# Patient Record
Sex: Male | Born: 1960 | Race: Black or African American | Hispanic: No | Marital: Married | Smoking: Former smoker
Health system: Southern US, Community
[De-identification: ages and names within clinical notes are randomized; demographics above are authoritative.]

## PROBLEM LIST (undated history)

## (undated) DIAGNOSIS — T7840XA Allergy, unspecified, initial encounter: Secondary | ICD-10-CM

## (undated) DIAGNOSIS — F1910 Other psychoactive substance abuse, uncomplicated: Secondary | ICD-10-CM

## (undated) DIAGNOSIS — M1990 Unspecified osteoarthritis, unspecified site: Secondary | ICD-10-CM

## (undated) DIAGNOSIS — I10 Essential (primary) hypertension: Secondary | ICD-10-CM

## (undated) DIAGNOSIS — H269 Unspecified cataract: Secondary | ICD-10-CM

## (undated) DIAGNOSIS — F329 Major depressive disorder, single episode, unspecified: Secondary | ICD-10-CM

## (undated) DIAGNOSIS — G4730 Sleep apnea, unspecified: Secondary | ICD-10-CM

## (undated) DIAGNOSIS — J189 Pneumonia, unspecified organism: Secondary | ICD-10-CM

## (undated) DIAGNOSIS — D689 Coagulation defect, unspecified: Secondary | ICD-10-CM

## (undated) DIAGNOSIS — R7303 Prediabetes: Secondary | ICD-10-CM

## (undated) DIAGNOSIS — E785 Hyperlipidemia, unspecified: Secondary | ICD-10-CM

## (undated) DIAGNOSIS — F32A Depression, unspecified: Secondary | ICD-10-CM

## (undated) DIAGNOSIS — R519 Headache, unspecified: Secondary | ICD-10-CM

## (undated) DIAGNOSIS — F419 Anxiety disorder, unspecified: Secondary | ICD-10-CM

## (undated) HISTORY — DX: Coagulation defect, unspecified: D68.9

## (undated) HISTORY — DX: Allergy, unspecified, initial encounter: T78.40XA

## (undated) HISTORY — DX: Sleep apnea, unspecified: G47.30

## (undated) HISTORY — DX: Unspecified cataract: H26.9

## (undated) HISTORY — DX: Major depressive disorder, single episode, unspecified: F32.9

## (undated) HISTORY — DX: Hyperlipidemia, unspecified: E78.5

## (undated) HISTORY — DX: Depression, unspecified: F32.A

## (undated) HISTORY — DX: Essential (primary) hypertension: I10

## (undated) HISTORY — DX: Anxiety disorder, unspecified: F41.9

## (undated) HISTORY — DX: Other psychoactive substance abuse, uncomplicated: F19.10

## (undated) HISTORY — DX: Unspecified osteoarthritis, unspecified site: M19.90

---
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
## 1996-01-06 HISTORY — PX: KNEE CARTILAGE SURGERY: SHX688

## 2008-01-06 HISTORY — PX: COLONOSCOPY: SHX174

## 2009-10-20 ENCOUNTER — Emergency Department (HOSPITAL_COMMUNITY)
Admission: EM | Admit: 2009-10-20 | Discharge: 2009-10-20 | Payer: Self-pay | Source: Home / Self Care | Admitting: Emergency Medicine

## 2010-01-05 HISTORY — PX: COLONOSCOPY W/ POLYPECTOMY: SHX1380

## 2012-06-21 ENCOUNTER — Ambulatory Visit: Payer: Self-pay | Admitting: Family Medicine

## 2012-06-29 ENCOUNTER — Encounter: Payer: Self-pay | Admitting: Family Medicine

## 2012-06-29 ENCOUNTER — Ambulatory Visit (INDEPENDENT_AMBULATORY_CARE_PROVIDER_SITE_OTHER): Payer: BC Managed Care – PPO | Admitting: Family Medicine

## 2012-06-29 VITALS — BP 133/79 | HR 76 | Temp 98.2°F | Ht 77.0 in | Wt 264.0 lb

## 2012-06-29 DIAGNOSIS — R635 Abnormal weight gain: Secondary | ICD-10-CM

## 2012-06-29 DIAGNOSIS — F3289 Other specified depressive episodes: Secondary | ICD-10-CM

## 2012-06-29 DIAGNOSIS — M25562 Pain in left knee: Secondary | ICD-10-CM | POA: Insufficient documentation

## 2012-06-29 DIAGNOSIS — M545 Low back pain, unspecified: Secondary | ICD-10-CM | POA: Insufficient documentation

## 2012-06-29 DIAGNOSIS — M25569 Pain in unspecified knee: Secondary | ICD-10-CM

## 2012-06-29 DIAGNOSIS — F17200 Nicotine dependence, unspecified, uncomplicated: Secondary | ICD-10-CM | POA: Insufficient documentation

## 2012-06-29 DIAGNOSIS — F329 Major depressive disorder, single episode, unspecified: Secondary | ICD-10-CM | POA: Insufficient documentation

## 2012-06-29 DIAGNOSIS — F411 Generalized anxiety disorder: Secondary | ICD-10-CM | POA: Insufficient documentation

## 2012-06-29 DIAGNOSIS — Z720 Tobacco use: Secondary | ICD-10-CM

## 2012-06-29 MED ORDER — CITALOPRAM HYDROBROMIDE 40 MG PO TABS: 40.0000 mg | ORAL_TABLET | Freq: Every day | ORAL | Status: DC

## 2012-06-29 MED ORDER — VARENICLINE TARTRATE 0.5 MG PO TABS: 1.0000 mg | ORAL_TABLET | Freq: Two times a day (BID) | ORAL | Status: DC

## 2012-06-29 NOTE — Assessment & Plan Note (Signed)
A: chronic pain. No evidence of inflammatory arthritis. P:  Knee x-ray Likely corticosteroid injection at f/u.

## 2012-06-29 NOTE — Assessment & Plan Note (Signed)
A: chronic. No red flags. P: DG lumbar and likely PT f/u.

## 2012-06-29 NOTE — Progress Notes (Addendum)
Subjective:     Patient ID: Jerry Stein, male   DOB: 01/25/1960, 52 y.o.   MRN: [id number]  HPI 52 yo M presents to established care: previous PCP in [place].  1. Chronic back pain: history of sciatica. Now with low back pain, L side. Radiates down thigh. Associated with L knee pain. No recent injury. No fecal/urinary incontinence. No numbness or LE weakness. Previously taking muscle relaxer (Skelaxin).   2. Chronic L knee pain: history of meniscal injury and cartilage surgery. Now with lateral knee pain. Associated with swelling.   3. Smoking: smokes 1/3 PPD. Ready to quit. successfully quit in the past with chantix.   4. Mood: history of anxiety and depression. Compliant with celexa 40 mg daily. Had BZ for two weeks. This helped with anxiety, but he is aware that this is a short term medication.   Health maintenance: colonoscopy in 2012 ([facility]).   Review of Systems Patient Information Form: Screening and ROS  AUDIT-C Score: 4 Do you feel safe in relationships? yes PHQ-2:positive  Review of Symptoms  General:  Negative for nexplained weight loss, fever Skin: Negative for new or changing mole, sore that won't heal HEENT: Positive trouble seeing (wears reading glasses, last eye exam 3-5 years ago). Negative for trouble hearing, ringing in ears, mouth sores, hoarseness, change in voice, dysphagia. CV:  Negative for chest pain, dyspnea, edema, palpitations Resp: Positive for cough (history of sleep apnea). Negative dyspnea, hemoptysis GI: Negative for nausea, vomiting, diarrhea, constipation, abdominal pain, melena, hematochezia. GU: Negative for dysuria, incontinence, urinary hesitance, hematuria, vaginal or penile discharge, polyuria, sexual difficulty, lumps in testicle or breasts MSK: Positive for joint pain or swelling. Negative for muscle cramps.  Neuro: Positive for dizziness.  Negative for headaches, weakness, numbness, passing out/fainting Psych: Positive for depression,  anxiety. Negative for memory problems    Objective:   Physical Exam BP 133/79  Pulse 76  Temp(Src) 98.2 F (36.8 C) (Oral)  Ht 6\' 5"  (1.956 m)  Wt 264 lb (119.75 kg)  BMI 31.3 kg/m2 General appearance: alert and cooperative Head: Normocephalic, without obvious abnormality, atraumatic Eyes: conjunctivae/corneas clear. PERRL, EOM's intact.  Ears: normal TM's and external ear canals both ears Nose: Nares normal. Septum midline. Mucosa normal. No drainage or sinus tenderness. Throat: lips, mucosa, and tongue normal; teeth and gums normal Neck: no adenopathy, no carotid bruit, no JVD, supple, symmetrical, trachea midline and thyroid not enlarged, symmetric, no tenderness/mass/nodules Lungs: clear to auscultation bilaterally Heart: regular rate and rhythm, S1, S2 normal, no murmur, click, rub or gallop Abdomen: soft, non-tender; bowel sounds normal; no masses,  no organomegaly Back: Normal Curvature, no deformities or CVA tenderness  Paraspinal Tenderness: L lumbar   LE Strength 5/5  LE Sensation: in tact  LE Reflexes 2+ and symmetric  Straight leg raise negative  L knee: Full ROM, crepitus, no effusion.      Assessment and Plan:

## 2012-06-29 NOTE — Assessment & Plan Note (Signed)
A: ready to quit P: restart chantix per orders. F/u in 2-4 weeks.

## 2012-06-29 NOTE — Patient Instructions (Addendum)
Jerry Stein,  Thank you for coming in today. It was very nice and easy. Please get your x-rays done at her convenience. Please schedule Jerry Stein appointment take her blood work done one morning when you're fasting.   Regarding smoking: Start Chantix take 1 tab daily for the first 3 days then increase to 1 tab twice daily for 4 days. Then increase to 2 tabs twice daily. The Chantix UR device to quit smoking within the first 8-35 days after you've started the medication. I recommended he set a quit date and put on your calendar frequently to see.  Plan to followup with me in 4-6 weeks review of x-rays and blood work.  Dr. Armen Pickup

## 2012-06-29 NOTE — Assessment & Plan Note (Signed)
A: patient working to lose, but has gained about 10 lbs P: Check fasting CMP and lipid panel.

## 2012-06-29 NOTE — Assessment & Plan Note (Signed)
A: mood stable P: continue celexa.

## 2012-07-05 ENCOUNTER — Telehealth: Payer: Self-pay | Admitting: Family Medicine

## 2012-07-05 MED ORDER — BUPROPION HCL ER (SR) 150 MG PO TB12: ORAL_TABLET | ORAL | Status: DC

## 2012-07-05 NOTE — Telephone Encounter (Signed)
Called patient. Called in zyban. Discussed how to take medication: one pill daily for 3 days, then BID (8 hrs apart, last dose before 6 pm) Quit after one week.  Plan to be on medication for 12 weeks.

## 2012-07-05 NOTE — Telephone Encounter (Signed)
Patient is calling about the Chantix being too expensive with his insurance.  Is there a way that he can get something like it cheaper.

## 2012-07-05 NOTE — Telephone Encounter (Signed)
Will fwd to MD.  Braylei Totino L, CMA  

## 2012-07-07 ENCOUNTER — Ambulatory Visit
Admission: RE | Admit: 2012-07-07 | Discharge: 2012-07-07 | Disposition: A | Discharge status: Home / Self Care | Payer: BC Managed Care – PPO | Source: Ambulatory Visit | Attending: Family Medicine | Admitting: Family Medicine

## 2012-07-07 DIAGNOSIS — M545 Low back pain: Secondary | ICD-10-CM

## 2012-07-07 DIAGNOSIS — M25562 Pain in left knee: Secondary | ICD-10-CM

## 2012-07-11 ENCOUNTER — Telehealth: Payer: Self-pay | Admitting: Family Medicine

## 2012-07-11 DIAGNOSIS — M545 Low back pain: Secondary | ICD-10-CM

## 2012-07-11 DIAGNOSIS — M25562 Pain in left knee: Secondary | ICD-10-CM

## 2012-07-11 MED ORDER — ACETAMINOPHEN ER 650 MG PO TBCR: 650.0000 mg | EXTENDED_RELEASE_TABLET | Freq: Three times a day (TID) | ORAL | Status: DC | PRN

## 2012-07-11 NOTE — Assessment & Plan Note (Signed)
Schedule tylenol  PT F/u fibular lesion with MRI if pain does not improve with above.

## 2012-07-11 NOTE — Telephone Encounter (Signed)
Called patient. Reviewed knee and low back x-ray.  Left knee DJD-medial and patellofemoral Radiolucent lesion L proximal fibular with benign features noted. Patient does have lateral knee pain.   Low back DJD. L5-S1.   Patient with moderate pain. Takes naproxen prn.  Plan: Schedule tylenol Referral to PT.

## 2012-07-12 ENCOUNTER — Telehealth: Payer: Self-pay | Admitting: Family Medicine

## 2012-07-12 DIAGNOSIS — R0683 Snoring: Secondary | ICD-10-CM | POA: Insufficient documentation

## 2012-07-12 DIAGNOSIS — Z720 Tobacco use: Secondary | ICD-10-CM

## 2012-07-12 NOTE — Telephone Encounter (Signed)
Opened in error

## 2012-07-20 ENCOUNTER — Ambulatory Visit: Payer: BC Managed Care – PPO | Admitting: Physical Therapy

## 2012-07-22 ENCOUNTER — Ambulatory Visit: Payer: BC Managed Care – PPO | Attending: Family Medicine

## 2012-07-22 DIAGNOSIS — R5381 Other malaise: Secondary | ICD-10-CM | POA: Insufficient documentation

## 2012-07-22 DIAGNOSIS — M6281 Muscle weakness (generalized): Secondary | ICD-10-CM | POA: Insufficient documentation

## 2012-07-22 DIAGNOSIS — M5430 Sciatica, unspecified side: Secondary | ICD-10-CM | POA: Insufficient documentation

## 2012-07-22 DIAGNOSIS — IMO0001 Reserved for inherently not codable concepts without codable children: Secondary | ICD-10-CM | POA: Insufficient documentation

## 2012-07-25 ENCOUNTER — Encounter: Payer: Self-pay | Admitting: Family Medicine

## 2012-07-26 ENCOUNTER — Ambulatory Visit: Payer: BC Managed Care – PPO | Admitting: Rehabilitation

## 2012-08-02 ENCOUNTER — Ambulatory Visit: Payer: BC Managed Care – PPO | Admitting: Rehabilitation

## 2012-08-04 ENCOUNTER — Ambulatory Visit: Payer: BC Managed Care – PPO | Admitting: Rehabilitation

## 2012-08-05 ENCOUNTER — Ambulatory Visit: Payer: BC Managed Care – PPO | Admitting: Rehabilitation

## 2012-08-09 ENCOUNTER — Ambulatory Visit: Payer: BC Managed Care – PPO | Admitting: Rehabilitation

## 2013-09-05 ENCOUNTER — Encounter: Payer: Self-pay | Admitting: Family Medicine

## 2013-09-05 ENCOUNTER — Ambulatory Visit (INDEPENDENT_AMBULATORY_CARE_PROVIDER_SITE_OTHER): Payer: BC Managed Care – PPO | Admitting: Family Medicine

## 2013-09-05 VITALS — BP 132/84 | HR 82 | Temp 98.3°F | Ht 77.0 in | Wt 265.5 lb

## 2013-09-05 DIAGNOSIS — F17200 Nicotine dependence, unspecified, uncomplicated: Secondary | ICD-10-CM

## 2013-09-05 DIAGNOSIS — Z720 Tobacco use: Secondary | ICD-10-CM

## 2013-09-05 DIAGNOSIS — M79672 Pain in left foot: Secondary | ICD-10-CM

## 2013-09-05 DIAGNOSIS — M79609 Pain in unspecified limb: Secondary | ICD-10-CM

## 2013-09-05 MED ORDER — NAPROXEN 500 MG PO TABS: 500.0000 mg | ORAL_TABLET | Freq: Two times a day (BID) | ORAL | Status: DC

## 2013-09-05 NOTE — Progress Notes (Signed)
Patient ID: Jerry Stein, male   DOB: Aug 17, 1960, 53 y.o.   MRN: [id number]  Kevin Fenton, MD Phone: [phone]  Subjective:  Chief complaint-noted  [name]  Pain started 9 months ago and has gradually worsened since then Not limiting his activuity, moderate in severity, causes him to limp Walking all day at work worsens it and tylenol and a brace help minimally No swelling, injury, or erythema Had previous ACL surgery on L and feels it is due to this.  The pain sometinmes is a burning sensation, sometimes radaites up the ant portion of his leg to his knee.   ROS-  Per HPI No fevers, dyspnea, chest pain  Past Medical History Patient Active Problem List   Diagnosis Date Noted  . Left foot pain 09/05/2013  . Snoring 07/12/2012  . Tobacco abuse 06/29/2012  . Depression 06/29/2012  . Generalized anxiety disorder 06/29/2012  . Weight gain 06/29/2012  . Left knee pain 06/29/2012  . Low back pain 06/29/2012    Medications- reviewed and updated Current Outpatient Prescriptions  Medication Sig Dispense Refill  . acetaminophen (TYLENOL 8 HOUR) 650 MG CR tablet Take 1 tablet (650 mg total) by mouth every 8 (eight) hours as needed for pain.      Marland Kitchen buPROPion (WELLBUTRIN SR) 150 MG 12 hr tablet One pill daily for 3 days, then one pill twice a day.  60 tablet  1  . citalopram (CELEXA) 40 MG tablet Take 1 tablet (40 mg total) by mouth daily.  90 tablet  0  . DHEA 50 MG CAPS Take 1 capsule by mouth daily.      . naproxen (NAPROSYN) 500 MG tablet Take 1 tablet (500 mg total) by mouth 2 (two) times daily with a meal.  60 tablet  2   No current facility-administered medications for this visit.    Objective: BP 132/84  Pulse 82  Temp(Src) 98.3 F (36.8 C) (Oral)  Ht  (1.956 m)  Wt 265 lb 8 oz (120.43 kg)  BMI 31.48 kg/m2 Gen: NAD, alert, cooperative with exam HEENT: NCAT Ext: No edema, warm Neuro: Alert and oriented, No gross deficits MSK: L foot without  erythema, sweeling, or gross deformity, strength 5/5 and full rom, Slight tenderness to palpation over distal portion of extensor retinaculum on dorsum of foot.    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   Tobacco abuse 2 ciggs a day, trying to quit Encouraged   No orders of the defined types were placed in this encounter.    Meds ordered this encounter  Medications  . naproxen (NAPROSYN) 500 MG tablet    Sig: Take 1 tablet (500 mg total) by mouth 2 (two) times daily with a meal.    Dispense:  60 tablet    Refill:  2

## 2013-09-05 NOTE — Assessment & Plan Note (Signed)
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

## 2013-09-05 NOTE — Assessment & Plan Note (Signed)
2 ciggs a day, trying to quit Encouraged

## 2013-09-05 NOTE — Patient Instructions (Signed)
Good to meet you!  Try to get your insurance, it would help a lot!  Try the naproxen I sent, if it is expensive you can use 2 aleve twice a day (generic is ok, you may be able to take just 2 at lunch and cover your whole day)  Try to ice it for 15 minutes 2-3 times a day  Consider an ankle sleeve  Come back as needed and if this doesn't resolve in the next month or so

## 2013-10-25 ENCOUNTER — Encounter (HOSPITAL_COMMUNITY): Payer: Self-pay | Admitting: Emergency Medicine

## 2013-10-25 ENCOUNTER — Emergency Department (HOSPITAL_COMMUNITY)
Admission: EM | Admit: 2013-10-25 | Discharge: 2013-10-25 | Disposition: A | Discharge status: Home / Self Care | Payer: BC Managed Care – PPO | Attending: Emergency Medicine | Admitting: Emergency Medicine

## 2013-10-25 DIAGNOSIS — M5442 Lumbago with sciatica, left side: Secondary | ICD-10-CM | POA: Insufficient documentation

## 2013-10-25 DIAGNOSIS — F419 Anxiety disorder, unspecified: Secondary | ICD-10-CM | POA: Insufficient documentation

## 2013-10-25 DIAGNOSIS — Z720 Tobacco use: Secondary | ICD-10-CM | POA: Insufficient documentation

## 2013-10-25 DIAGNOSIS — F329 Major depressive disorder, single episode, unspecified: Secondary | ICD-10-CM | POA: Insufficient documentation

## 2013-10-25 DIAGNOSIS — Z79899 Other long term (current) drug therapy: Secondary | ICD-10-CM | POA: Insufficient documentation

## 2013-10-25 DIAGNOSIS — M791 Myalgia: Secondary | ICD-10-CM | POA: Insufficient documentation

## 2013-10-25 MED ORDER — PREDNISONE 20 MG PO TABS: 40.0000 mg | ORAL_TABLET | Freq: Every day | ORAL | Status: DC

## 2013-10-25 MED ORDER — OXYCODONE-ACETAMINOPHEN 5-325 MG PO TABS: 1.0000 | ORAL_TABLET | Freq: Four times a day (QID) | ORAL | Status: DC | PRN

## 2013-10-25 MED ORDER — CYCLOBENZAPRINE HCL 10 MG PO TABS: 10.0000 mg | ORAL_TABLET | Freq: Every day | ORAL | Status: DC

## 2013-10-25 NOTE — Discharge Instructions (Signed)
Call for a follow up appointment with a Family or Primary Care Provider.  Return if symptoms worsen.   Take medication as prescribed.  Do not operate heavy machinery, drink alcohol with taking narcotic pain medication and/or flexeril medication.

## 2013-10-25 NOTE — ED Notes (Signed)
Pt reports having muscles spasms and pain to lower back x 2 days. Hx of same in past. Ambulatory at triage with walker.

## 2013-10-25 NOTE — ED Provider Notes (Signed)
CSN: [id number]     Arrival date & time 10/25/13  1125 History  This chart was scribed for non-physician practitioner, Jerry SealLauren M Chen Saadeh, PA-C, working with Jerry KaplanAnkit Nanavati, MD by Jerry Stein, ED Scribe. This patient was seen in room TR05C/TR05C and the patient's care was started at 12:50 PM.   Chief Complaint  Patient presents with  . Back Pain   HPI Comments: Jerry Stein is a 53 y.o. male who presents to the Emergency Department complaining of lower back pain that radiates down left leg onset 2 days ago. Pt reports similar pain in the past. Pt reports lifting a kidney table at work 1 week ago, denies immediate pain reports increase in discomfort over several days. Pain is exacerbated with certain movements. He denies abdominal pain, any urinary symptoms, numbness. Pt has taken Naproxen x2 daily without relief. No h/o DM.  PCP: Jerry Stein  The history is provided by the patient. No language interpreter was used.   Past Medical History  Diagnosis Date  . Depression   . Anxiety   . Substance abuse 1979    once    Past Surgical History  Procedure Laterality Date  . Knee cartilage surgery  1998  . Eye surgery Left 1976    blood clot in retina, removed, 1 month of vision loss  . Colonoscopy w/ polypectomy  2012    5 benign polyps    Family History  Problem Relation Age of Onset  . Kidney disease Mother     died of renal failure   . Heart disease Mother     had pacemaker, took NTG.   . Cancer Sister [id number]    breast cancer   . Alcohol abuse Brother   . Cirrhosis Brother     related to alcoholism  . Cancer Maternal Grandmother     uterine cancer   . Hypertension Neg Hx   . Diabetes Brother    History  Substance Use Topics  . Smoking status: Current Every Day Smoker -- 0.30 packs/day    Types: Cigarettes  . Smokeless tobacco: Never Used     Comment: tring to quit  . Alcohol Use: Yes     Comment: 4-5 drinks a month     Review of Systems  Constitutional: Negative for  fever and chills.  Gastrointestinal: Negative for abdominal pain.  Genitourinary: Negative.  Negative for dysuria, urgency, hematuria, flank pain and difficulty urinating.  Musculoskeletal: Positive for back pain and myalgias.  Skin: Negative for wound.  Neurological: Negative for weakness and numbness.  All other systems reviewed and are negative.  Allergies  Shrimp  Home Medications   Prior to Admission medications   Medication Sig Start Date End Date Taking? Authorizing Provider  acetaminophen (TYLENOL 8 HOUR) 650 MG CR tablet Take 1 tablet (650 mg total) by mouth every 8 (eight) hours as needed for pain. 07/11/12   Jerry CarwinJosalyn C Funches, MD  buPROPion (WELLBUTRIN SR) 150 MG 12 hr tablet One pill daily for 3 days, then one pill twice a day. 07/05/12   Jerry C Funches, MD  citalopram (CELEXA) 40 MG tablet Take 1 tablet (40 mg total) by mouth daily. 06/29/12   Jerry C Funches, MD  DHEA 50 MG CAPS Take 1 capsule by mouth daily.    Historical Provider, MD  naproxen (NAPROSYN) 500 MG tablet Take 1 tablet (500 mg total) by mouth 2 (two) times daily with a meal. 09/05/13   Jerry GammaSamuel L Bradshaw, MD   Triage Vitals: BP 144/91  Pulse 71  Temp(Src) 97.9 F (36.6 C) (Oral)  Resp 18  SpO2 96% Physical Exam  Nursing note and vitals reviewed. Constitutional: He is oriented to person, place, and time. He appears well-developed and well-nourished. No distress.  HENT:  Head: Normocephalic and atraumatic.  Eyes: Conjunctivae and EOM are normal.  Neck: Neck supple.  Pulmonary/Chest: Effort normal. No respiratory distress.  Abdominal: Normal appearance. There is no tenderness.  Musculoskeletal: Normal range of motion.       Lumbar back: He exhibits tenderness. He exhibits normal range of motion.       Back:  Palpation of left SI joint recreates discomfort. Positive leg raise. Normal sensation and strength bilaterally. Patient is able to ambulate with a walker.  Neurological: He is alert and oriented to  person, place, and time.  Skin: Skin is warm and dry. He is not diaphoretic.  Psychiatric: He has a normal mood and affect. His behavior is normal.   ED Course  Procedures (including critical care time) DIAGNOSTIC STUDIES: Oxygen Saturation is 96% on RA, adequate by my interpretation.    COORDINATION OF CARE: 12:57 PM-Discussed treatment plan which includes medication for pain and follow-up with PCP with pt at bedside and pt agreed to plan.   Labs Review Labs Reviewed - No data to display  Imaging Review No results found.   EKG Interpretation None      MDM   Final diagnoses:  Bilateral low back pain with left-sided sciatica   Patient with back pain.  No neurological deficits and normal neuro exam.  Patient can walk but states is painful.  No loss of bowel or bladder control.  No concern for cauda equina.  No fever, night sweats, weight loss, h/o cancer, IVDU.  RICE protocol and pain medicine indicated and discussed with patient.  EMR shows lumbar XR 07/07/2012: IMPRESSION: Normal alignment. Mild degenerative disc disease at L5-S1. Meds given in ED:  Medications - No data to display  Discharge Medication List as of 10/25/2013  1:10 PM    START taking these medications   Details  cyclobenzaprine (FLEXERIL) 10 MG tablet Take 1 tablet (10 mg total) by mouth at bedtime., Starting 10/25/2013, Until Discontinued, Print    oxyCODONE-acetaminophen (PERCOCET) 5-325 MG per tablet Take 1 tablet by mouth every 6 (six) hours as needed., Starting 10/25/2013, Until Discontinued, Print    predniSONE (DELTASONE) 20 MG tablet Take 2 tablets (40 mg total) by mouth daily., Starting 10/25/2013, Until Discontinued, Print       I personally performed the services described in this documentation, which was scribed in my presence. The recorded information has been reviewed and is accurate.  Jerry DrownLauren Semaya Vida, PA-C 10/25/13 [phone]

## 2013-10-26 NOTE — ED Provider Notes (Signed)
Medical screening examination/treatment/procedure(s) were performed by non-physician practitioner and as supervising physician I was immediately available for consultation/collaboration.   EKG Interpretation None       Carsen Machi, MD 10/26/13 0836 

## 2013-12-05 ENCOUNTER — Encounter: Payer: Self-pay | Admitting: Family Medicine

## 2013-12-05 ENCOUNTER — Ambulatory Visit (INDEPENDENT_AMBULATORY_CARE_PROVIDER_SITE_OTHER): Payer: Self-pay | Admitting: Family Medicine

## 2013-12-05 VITALS — BP 127/92 | HR 91 | Temp 98.1°F | Ht 77.0 in | Wt 266.9 lb

## 2013-12-05 DIAGNOSIS — M545 Low back pain, unspecified: Secondary | ICD-10-CM

## 2013-12-05 DIAGNOSIS — M79672 Pain in left foot: Secondary | ICD-10-CM

## 2013-12-05 MED ORDER — AMITRIPTYLINE HCL 25 MG PO TABS: 25.0000 mg | ORAL_TABLET | Freq: Every day | ORAL | Status: DC

## 2013-12-05 MED ORDER — NAPROXEN 375 MG PO TABS: 375.0000 mg | ORAL_TABLET | Freq: Two times a day (BID) | ORAL | Status: DC

## 2013-12-05 NOTE — Progress Notes (Signed)
Patient ID: Jerry Stein, male   DOB: February 13, 1960, 53 y.o.   MRN: [id number]   HPI  Patient presents today for follow-up low back pain  Low back pain Has been bothering him chronically for 20+ years. States that the pain is a 7 out of 10 today and has been helped some by naproxen today. He had good relief with Percocet, Flexeril, and prednisone prescribed by the ER about a month ago He denies bowel or bladder dysfunction, saddle anesthesia, lower extremity weakness During his acute pain he had pain with lifting his leg but none today. States that he has instructions for exercises from physical therapy previously that he started doing again since his acute exacerbation.  Left foot pain Bothering him daily for the last year. Has had some improvement with NSAIDs No swelling, redness, or injury of the foot.  Smoking status noted ROS: Per HPI  He has not been taking his Wellbutrin. States he is taking his Celexa  Objective: BP 127/92 mmHg  Pulse 91  Temp(Src) 98.1 F (36.7 C) (Oral)  Ht 6\' 5"  (1.956 m)  Wt 266 lb 14.4 oz (121.065 kg)  BMI 31.64 kg/m2 Gen: NAD, alert, cooperative with exam HEENT: NCAT, EOMI, PERRL CV: RRR, good S1/S2, no murmur Resp: CTABL, no wheezes, non-labored Abd: SNTND, BS present, no guarding or organomegaly Ext: No edema, warm Neuro: Alert and oriented, strength 5/5 in bilateral lower extremities, 1+ patellar reflexes MSK: Back: Him relief of pain with paraspinal palpation, no bony tenderness, full range of motion Left foot: Tenderness to palpation over the talus and medially across the extensor retinaculum, no joint laxity, no drawer sign, no erythema or gross deformity  Assessment and plan:  Low back pain Chronic back pain with spasms Start trial of amitriptyline, DC Flexeril Continue NSAIDs as needed  Left foot pain Left ankle pain, likely tendinopathy Some concern for pes planus Amitriptyline started for back spasm may help as well Will refer  to sports medicine for evaluation of ankle pain and possible orthotics.    Orders Placed This Encounter  Procedures  . Ambulatory referral to Sports Medicine    Referral Priority:  Routine    Referral Type:  Consultation    Number of Visits Requested:  1    Meds ordered this encounter  Medications  . amitriptyline (ELAVIL) 25 MG tablet    Sig: Take 1 tablet (25 mg total) by mouth at bedtime.    Dispense:  30 tablet    Refill:  2

## 2013-12-05 NOTE — Assessment & Plan Note (Signed)
Chronic back pain with spasms Start trial of amitriptyline, DC Flexeril Continue NSAIDs as needed

## 2013-12-05 NOTE — Patient Instructions (Signed)
Great to meet you today!  Try the amitriptyline, take this medicine every night  You should be called by sports medicine to set up an appointment with them.  Come back to see me in 2-3 months.  Remember the red flags of emergency back pain we discussed which include bowel or bladder incontinence, numbness and tingling in her underwear area, or weakness of your legs.

## 2013-12-05 NOTE — Assessment & Plan Note (Addendum)
Left ankle pain, likely tendinopathy Some concern for pes planus Amitriptyline started for back spasm may help as well Will refer to sports medicine for evaluation of ankle pain and possible orthotics.

## 2014-01-11 ENCOUNTER — Ambulatory Visit: Payer: Self-pay | Admitting: Sports Medicine

## 2014-01-19 ENCOUNTER — Encounter: Payer: Self-pay | Admitting: Family Medicine

## 2014-01-19 ENCOUNTER — Ambulatory Visit (INDEPENDENT_AMBULATORY_CARE_PROVIDER_SITE_OTHER): Payer: 59 | Admitting: Family Medicine

## 2014-01-19 VITALS — BP 144/102 | Ht 78.0 in | Wt 256.0 lb

## 2014-01-19 DIAGNOSIS — M2141 Flat foot [pes planus] (acquired), right foot: Secondary | ICD-10-CM | POA: Insufficient documentation

## 2014-01-19 DIAGNOSIS — M25572 Pain in left ankle and joints of left foot: Secondary | ICD-10-CM | POA: Insufficient documentation

## 2014-01-19 DIAGNOSIS — M2142 Flat foot [pes planus] (acquired), left foot: Secondary | ICD-10-CM

## 2014-01-19 MED ORDER — METHYLPREDNISOLONE ACETATE 40 MG/ML IJ SUSP
40.0000 mg | Freq: Once | INTRAMUSCULAR | Status: AC
  Administered 2014-01-19: 40 mg via INTRA_ARTICULAR

## 2014-01-19 NOTE — Assessment & Plan Note (Addendum)
-  Sport insoles with scaphoid pads crafted for the patient today -Ankle exercises -Follow-up in 1-2 months, may benefit from a body helix compression sleeve for the left ankle and/or custom orthotics. He is sized 16 shoe.

## 2014-01-19 NOTE — Assessment & Plan Note (Signed)
-  Seemingly due to lateral ankle impingement syndrome due to pes planus -Obtain x-rays of the left ankle -Likely ankle arthritis is a contributing factor -Ankle injection performed today, patient tolerated this well. -Follow-up in 1-2 months or sooner if needed

## 2014-01-19 NOTE — Progress Notes (Signed)
   Subjective:    Patient ID: Arcelia JewJeffrey Varricchio, male    DOB: 03/08/1960, 54 y.o.   MRN: [id number]  HPI Mr. Leonor LivHolt is a 54 year old male who presents with left ankle pain. Onset of symptoms was over 2 years ago without any known acute injury. He does have a remote history of repeated left ankle inversion injuries when he was younger. He has bilateral pes planus, and has tried over-the-counter insoles. Location of pain is primarily anterior lateral talar joint. He denies any swelling, bruising, warmth, or erythema. He works for 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 Symptoms are worse towards the end of the day, and aggravated with standing and walking. He has tried naproxen twice daily, and he is on amitriptyline for chronic back pain.  Past medical history, social history, medications, and allergies were reviewed and are up to date in the chart.  Review of Systems 7 point review of systems was performed and was otherwise negative unless noted in the history of present illness.     Objective:   Physical Exam BP 144/102 mmHg  Ht 6\' 6"  (1.981 m)  Wt 256 lb (116.121 kg)  BMI 29.59 kg/m2 GEN: The patient is well-developed well-nourished male and in no acute distress.  He is awake alert and oriented x3. SKIN: warm and well-perfused, no rash  EXTR: No lower extremity edema or calf tenderness Neuro: Strength 5/5 globally. Sensation intact throughout. No focal deficits. Vasc: +2 bilateral distal pulses. No edema.  MSK: Examination of the bilateral feet reveals dropping of the medial longitudinal arch with hyperpronation bilaterally. He also has some loss of the transverse arch over the third through fifth metatarsal heads on the left. No warmth or induration. Normal swelling. Tenderness to palpation located in the lateral talar joint. No bony tenderness.  Procedure: After obtaining verbal consent the patient's skin  was cleansed with alcohol and Betadine.  He was subsequently injected with a 1-3 mixture of methylprednisolone 40mg  per 3 mL of 1% lidocaine plain into the left ankle joint using the anterior medial approach.  Prior to the procedure risks, benefits and treatment alternatives were discussed.  He had no complications.  He tolerated the procedure.    Assessment & Plan:  Please see problem based assessment and plan in the problem list.

## 2014-01-22 ENCOUNTER — Ambulatory Visit
Admission: RE | Admit: 2014-01-22 | Discharge: 2014-01-22 | Disposition: A | Discharge status: Home / Self Care | Payer: 59 | Source: Ambulatory Visit | Attending: Family Medicine | Admitting: Family Medicine

## 2014-01-22 DIAGNOSIS — M25572 Pain in left ankle and joints of left foot: Secondary | ICD-10-CM

## 2014-02-07 ENCOUNTER — Encounter: Payer: Self-pay | Admitting: Family Medicine

## 2014-03-02 ENCOUNTER — Ambulatory Visit (INDEPENDENT_AMBULATORY_CARE_PROVIDER_SITE_OTHER): Payer: 59 | Admitting: Family Medicine

## 2014-03-02 DIAGNOSIS — M25572 Pain in left ankle and joints of left foot: Secondary | ICD-10-CM

## 2014-03-04 NOTE — Progress Notes (Signed)
Opened in error. Pt cancelled.

## 2014-03-05 ENCOUNTER — Ambulatory Visit: Payer: 59 | Admitting: Family Medicine

## 2014-03-09 ENCOUNTER — Encounter: Payer: Self-pay | Admitting: Family Medicine

## 2014-03-09 ENCOUNTER — Ambulatory Visit (INDEPENDENT_AMBULATORY_CARE_PROVIDER_SITE_OTHER): Payer: 59 | Admitting: Family Medicine

## 2014-03-09 VITALS — BP 140/90 | HR 90 | Temp 98.0°F | Ht 78.0 in | Wt 260.0 lb

## 2014-03-09 DIAGNOSIS — M2142 Flat foot [pes planus] (acquired), left foot: Secondary | ICD-10-CM

## 2014-03-09 DIAGNOSIS — IMO0001 Reserved for inherently not codable concepts without codable children: Secondary | ICD-10-CM

## 2014-03-09 DIAGNOSIS — R0683 Snoring: Secondary | ICD-10-CM

## 2014-03-09 DIAGNOSIS — R030 Elevated blood-pressure reading, without diagnosis of hypertension: Secondary | ICD-10-CM

## 2014-03-09 DIAGNOSIS — M2141 Flat foot [pes planus] (acquired), right foot: Secondary | ICD-10-CM

## 2014-03-09 DIAGNOSIS — F411 Generalized anxiety disorder: Secondary | ICD-10-CM

## 2014-03-09 MED ORDER — AMITRIPTYLINE HCL 50 MG PO TABS: 50.0000 mg | ORAL_TABLET | Freq: Every day | ORAL | Status: DC

## 2014-03-09 NOTE — Patient Instructions (Addendum)
Great to see you!  We will work on an ENT referral, when I get your records we can work on a CPAP or BiPAP machine  I have sent an crease dose of amitriptyline today, you may start taking 2 pills every night until you are out of your current pills and then get a refill.  Let's follow up in one month to discuss your mood in more detail.   Sleep Apnea Sleep apnea is disorder that affects a person's sleep. A person with sleep apnea has abnormal pauses in their breathing when they sleep. It is hard for them to get a good sleep. This makes a person tired during the day. It also can lead to other physical problems. There are three types of sleep apnea. One type is when breathing stops for a short time because your airway is blocked (obstructive sleep apnea). Another type is when the brain sometimes fails to give the normal signal to breathe to the muscles that control your breathing (central sleep apnea). The third type is a combination of the other two types. HOME CARE  Do not sleep on your back. Try to sleep on your side.  Take all medicine as told by your doctor.  Avoid alcohol, calming medicines (sedatives), and depressant drugs.  Try to lose weight if you are overweight. Talk to your doctor about a healthy weight goal. Your doctor may have you use a device that helps to open your airway. It can help you get the air that you need. It is called a positive airway pressure (PAP) device. There are three types of PAP devices:  Continuous positive airway pressure (CPAP) device.  Nasal expiratory positive airway pressure (EPAP) device.  Bilevel positive airway pressure (BPAP) device. MAKE SURE YOU:  Understand these instructions.  Will watch your condition.  Will get help right away if you are not doing well or get worse. Document Released: 10/01/2007 Document Revised: 12/09/2011 Document Reviewed: 04/25/2011 [facility] Patient Information 2015 [employer] [place]. This information is not intended  to replace advice given to you by your health care provider. Make sure you discuss any questions you have with your health care provider.

## 2014-03-09 NOTE — Progress Notes (Signed)
Patient ID: Jerry Stein, male   DOB: 1960/09/05, 54 y.o.   MRN: [id number]   HPI  Patient presents today for follow-up and to request ENT referral  Snoring and sleep study Patient explains that he was seen in [place] by an ear nose and throat doctor who did a sleep study and stated that he had sleep apnea. He stated that he may benefit from a tonsillectomy/adenoidectomy, so he would like to be referred to ENT today He states he has daytime sleepiness and severe snoring that disrupts his wife.  Pain Has left foot pain due to pes planus, takes 1 naproxen twice daily with some improvement of pain Also taking amitriptyline which seems to be helping his mood as well as his pain, it also helps him sleep.  Mood Has anxiety/depression disorder with some symptoms of both, denies SI today Is taking amitriptyline, has not been taking citalopram for quite some time.  Smoking status noted ROS: Per HPI  Objective: BP 140/90 mmHg  Pulse 90  Temp(Src) 98 F (36.7 C) (Oral)  Ht 6\' 6"  (1.981 m)  Wt 260 lb (117.935 kg)  BMI 30.05 kg/m2 Gen: NAD, alert, cooperative with exam HEENT: NCAT CV: RRR, good S1/S2, no murmur Resp: CTABL, no wheezes, non-labored Ext: No edema, warm Neuro: Alert and oriented, No gross deficits  Assessment and plan:  Generalized anxiety disorder Follow-up in one month to discuss in more detail No SI Go up on amitriptyline   Snoring Per patient he saw ENT in [place] and had a sleep study which said that he had OSA I requested records today His ENT thought he might benefit from tonsillectomy and adenoidectomy, will refer to ENT for reevaluation here    Pes planus of both feet Continued pain helped by naproxen Increase amitriptyline to help alleviate the burden of chronic pain   Elevated blood pressure Elevated BP, No previous HTN Dx Discuss at follow up in 1 month     Orders Placed This Encounter  Procedures  . Ambulatory referral to ENT   Referral Priority:  Routine    Referral Type:  Consultation    Referral Reason:  Specialty Services Required    Requested Specialty:  Otolaryngology    Number of Visits Requested:  1    Meds ordered this encounter  Medications  . amitriptyline (ELAVIL) 50 MG tablet    Sig: Take 1 tablet (50 mg total) by mouth at bedtime.    Dispense:  30 tablet    Refill:  2

## 2014-03-09 NOTE — Assessment & Plan Note (Signed)
Elevated BP, No previous HTN Dx Discuss at follow up in 1 month

## 2014-03-09 NOTE — Assessment & Plan Note (Signed)
Per patient he saw [place]NT in [place] and had a sleep study which said that he had OSA I requested records today His ENT thought he might benefit from tonsillectomy and adenoidectomy, will refer to ENT for reevaluation here

## 2014-03-09 NOTE — Assessment & Plan Note (Signed)
Continued pain helped by naproxen Increase amitriptyline to help alleviate the burden of chronic pain

## 2014-03-09 NOTE — Assessment & Plan Note (Signed)
Follow-up in one month to discuss in more detail No SI Go up on amitriptyline

## 2014-03-13 ENCOUNTER — Telehealth: Payer: Self-pay | Admitting: Family Medicine

## 2014-03-13 DIAGNOSIS — F411 Generalized anxiety disorder: Secondary | ICD-10-CM

## 2014-03-13 NOTE — Telephone Encounter (Signed)
Would like RX for something to quit smoking [facility] [place]

## 2014-03-15 MED ORDER — BUPROPION HCL ER (SR) 150 MG PO TB12: 150.0000 mg | ORAL_TABLET | Freq: Two times a day (BID) | ORAL | Status: DC

## 2014-03-15 NOTE — Telephone Encounter (Signed)
Pt wants to know if amitriptyline is compatible with the generic of Wellbutrin Please advise

## 2014-03-15 NOTE — Telephone Encounter (Signed)
Yes, it is ok to have both.   Murtis SinkSam Bradshaw, MD [facility] Resident, PGY-3 03/15/2014, 3:43 PM

## 2014-03-15 NOTE — Telephone Encounter (Signed)
Pt informed and will come in and sign release form. Deseree Bruna PotterBlount, CMA

## 2014-03-15 NOTE — Telephone Encounter (Signed)
Called and discussed Celexa refill with patient. We have previously discussed that he was not taking it for several months. My assumption was that he understood that he was changing to amitriptyline. I told him that I prefer to just continue on amitriptyline and not take any Celexa, so denies a refill request.  He also requests something to help him quit smoking. He was successful with Chantix previously requests Chantix or Wellbutrin. Given his anxiety and depression I think Wellbutrin is probably the most appropriate medication. I sent the Rx.  Murtis SinkSam Shiron Whetsel, MD [facility] Resident, PGY-3 03/15/2014, 8:33 AM

## 2014-03-15 NOTE — Telephone Encounter (Signed)
Additionally, he left our last visit without signing the back side of his consent to get his medical records. Nursing tried to reach him but couldn't, he states that he is willing to sign it and would like to, will ask nursing to contact him and arrange.  Murtis SinkSam Bradshaw, MD [facility] Resident, PGY-3 03/15/2014, 8:34 AM

## 2014-04-02 ENCOUNTER — Telehealth: Payer: Self-pay | Admitting: Family Medicine

## 2014-04-02 NOTE — Telephone Encounter (Signed)
Received notes from ENT. Patient had Sleep study ordered but no results were sent.   Will ask nurisng to help me track down Sleep study results.   Saw Dr. Archie Balboaobert Harley at [phone]  Murtis SinkSam Bradshaw, MD [facility] Resident, PGY-3 04/02/2014, 12:25 PM

## 2014-04-03 NOTE — Telephone Encounter (Signed)
ROI refaxed to Dr. Verdene RioHarley's office, specifically asking for sleep study results.

## 2014-06-26 ENCOUNTER — Ambulatory Visit (HOSPITAL_BASED_OUTPATIENT_CLINIC_OR_DEPARTMENT_OTHER): Payer: 59 | Attending: Otolaryngology

## 2014-06-26 DIAGNOSIS — G4733 Obstructive sleep apnea (adult) (pediatric): Secondary | ICD-10-CM | POA: Insufficient documentation

## 2014-06-27 ENCOUNTER — Ambulatory Visit: Payer: 59 | Admitting: Family Medicine

## 2014-07-05 ENCOUNTER — Ambulatory Visit (INDEPENDENT_AMBULATORY_CARE_PROVIDER_SITE_OTHER): Payer: 59 | Admitting: Family Medicine

## 2014-07-05 ENCOUNTER — Encounter: Payer: Self-pay | Admitting: Family Medicine

## 2014-07-05 VITALS — BP 147/83 | HR 95 | Temp 97.9°F | Ht 78.0 in | Wt 275.4 lb

## 2014-07-05 DIAGNOSIS — M25572 Pain in left ankle and joints of left foot: Secondary | ICD-10-CM | POA: Diagnosis not present

## 2014-07-05 DIAGNOSIS — M79673 Pain in unspecified foot: Secondary | ICD-10-CM | POA: Diagnosis not present

## 2014-07-05 MED ORDER — METHYLPREDNISOLONE ACETATE 40 MG/ML IJ SUSP
40.0000 mg | Freq: Once | INTRAMUSCULAR | Status: AC
  Administered 2014-07-05: 40 mg via INTRA_ARTICULAR

## 2014-07-05 NOTE — Patient Instructions (Signed)
It was a pleasure seeing you today in our clinic. Today we discussed your ankle pain. Here is the treatment plan we have discussed and agreed upon together:   - We were able to inject your ankle today using a similar approach and medication to what you received for your first injection. This should provided similar relief as the first injection. However, it is not uncommon to have a slightly reduced effect the second time around. - If your ankle becomes red, tender, or inflamed over the next week please do not hesitate to seek medical attention. If this is necessary, please inform them of this injection.

## 2014-07-06 NOTE — Assessment & Plan Note (Signed)
Patient c/o ankle pain c/w osteoarthritic pain. He had received an injection in this ankle back in January and tolerated the procedure well. He states this injection's benefit is only just now diminishing. Patient received another injection at this office visit. He tolerated it very well. Reported immediate relief. I discussed w/ him the risks and warning signs of infection. He states his understanding.

## 2014-07-06 NOTE — Progress Notes (Signed)
   HPI  CC: Lt foot pain Patient c/o Lt foot pain. He was seen for this issue by sports medicine back in January. He received a steroid injection at that time. He reports great relief from that injection but it has started to worsen recently. Pain is achy in nature. Localized to the anterolateral aspect of the talocrural joint. He states the the pain is slowly making its way medially. Worse with prolonged use. He denies trauma, swelling, erythema, weakness, instability, numbness, fevers, chills, or swelling/erythema/tenderness in other joints.  Review of Systems   See HPI for ROS. All other systems reviewed and are negative.  Past medical history and social history reviewed and updated in the EMR as appropriate.  Objective: BP 147/83 mmHg  Pulse 95  Temp(Src) 97.9 F (36.6 C) (Oral)  Ht 6\' 6"  (1.981 m)  Wt 275 lb 6.4 oz (124.921 kg)  BMI 31.83 kg/m2 Gen: NAD, alert, cooperative CV: RRR, no murmur Resp: CTAB, no wheezes, non-labored Abd: SNTND, BS present, no guarding or organomegaly Ext: No edema, warm. Bilateral LE w/o swelling, erythema, or severe tenderness. ROM good but reports some pain w/ FROM. Strength 5/5 bilaterally. Sensation intact. Posterior tibialis/dorsalis pedis pulses intact bilaterally. Neuro: Alert and oriented, Speech clear, No gross deficits  Procedure: INJECTION: Patient was given informed consent, signed copy in the chart. Appropriate time out was taken. Area prepped in usual sterile fashion. 1 cc of methylprednisolone 40 mg/ml plus  3 cc of 1% lidocaine without epinephrine was injected into the Lt talocrural joint using a(n) anteromedial approach. The patient tolerated the procedure well. There were no complications.   Assessment and plan:  Left ankle pain Patient c/o ankle pain c/w osteoarthritic pain. He had received an injection in this ankle back in January and tolerated the procedure well. He states this injection's benefit is only just now diminishing.  Patient received another injection at this office visit. He tolerated it very well. Reported immediate relief. I discussed w/ him the risks and warning signs of infection. He states his understanding.    Meds ordered this encounter  Medications  . methylPREDNISolone acetate (DEPO-MEDROL) injection 40 mg    Sig:      Kathee DeltonIan D Jihad Brownlow, MD,MS,  PGY1 07/06/2014 6:54 PM

## 2014-07-10 ENCOUNTER — Encounter (HOSPITAL_BASED_OUTPATIENT_CLINIC_OR_DEPARTMENT_OTHER): Payer: 59

## 2014-08-01 ENCOUNTER — Telehealth: Payer: Self-pay | Admitting: Family Medicine

## 2014-08-01 NOTE — Telephone Encounter (Signed)
Pt called and would like to know what his sleep studies results are. Myriam Jacobson

## 2014-08-01 NOTE — Telephone Encounter (Signed)
Will forward to PCP, sleep study scanned in under media tab.

## 2014-08-03 NOTE — Telephone Encounter (Signed)
Sleep study was not fully assessed by the provider performing the test. I have called and left a message directing patient to the physician who initially ordered the test -- ENT

## 2014-08-23 ENCOUNTER — Other Ambulatory Visit: Payer: Self-pay | Admitting: Family Medicine

## 2014-10-20 ENCOUNTER — Other Ambulatory Visit: Payer: Self-pay | Admitting: Family Medicine

## 2014-11-19 ENCOUNTER — Other Ambulatory Visit: Payer: Self-pay | Admitting: Family Medicine

## 2014-11-20 ENCOUNTER — Encounter: Payer: Self-pay | Admitting: Family Medicine

## 2014-11-20 ENCOUNTER — Ambulatory Visit (INDEPENDENT_AMBULATORY_CARE_PROVIDER_SITE_OTHER): Payer: 59 | Admitting: Family Medicine

## 2014-11-20 VITALS — BP 155/92 | HR 90 | Temp 98.6°F | Wt 275.8 lb

## 2014-11-20 DIAGNOSIS — J329 Chronic sinusitis, unspecified: Secondary | ICD-10-CM

## 2014-11-20 DIAGNOSIS — R030 Elevated blood-pressure reading, without diagnosis of hypertension: Secondary | ICD-10-CM | POA: Diagnosis not present

## 2014-11-20 DIAGNOSIS — IMO0001 Reserved for inherently not codable concepts without codable children: Secondary | ICD-10-CM

## 2014-11-20 MED ORDER — AMOXICILLIN-POT CLAVULANATE 875-125 MG PO TABS: 1.0000 | ORAL_TABLET | Freq: Two times a day (BID) | ORAL | Status: DC

## 2014-11-20 NOTE — Patient Instructions (Signed)
You have a sinus infection. This was probably caused by a viral infection, but since it has been going on for 2 weeks, we will treat you with antibiotics. Please continue to use the zyrtec and flonase. You can also use over the counter decongestants as needed. If you are not feeling better in 5-7 days, please let us know.   Sinusitis, Adult Sinusitis is redness, soreness, and puffiness (inflammation) of the air pockets in the bones of your face (sinuses). The redness, soreness, and puffiness can cause air and mucus to get trapped in your sinuses. This can allow germs to grow and cause an infection.  HOME CARE   Drink enough fluids to keep your pee (urine) clear or pale yellow.  Use a humidifier in your home.  Run a hot shower to create steam in the bathroom. Sit in the bathroom with the door closed. Breathe in the steam 3-4 times a day.  Put a warm, moist washcloth on your face 3-4 times a day, or as told by your doctor.  Use salt water sprays (saline sprays) to wet the thick fluid in your nose. This can help the sinuses drain.  Only take medicine as told by your doctor. GET HELP RIGHT AWAY IF:   Your pain gets worse.  You have very bad headaches.  You are sick to your stomach (nauseous).  You throw up (vomit).  You are very sleepy (drowsy) all the time.  Your face is puffy (swollen).  Your vision changes.  You have a stiff neck.  You have trouble breathing. MAKE SURE YOU:   Understand these instructions.  Will watch your condition.  Will get help right away if you are not doing well or get worse.   This information is not intended to replace advice given to you by your health care provider. Make sure you discuss any questions you have with your health care provider.   Document Released: 06/10/2007 Document Revised: 01/12/2014 Document Reviewed: 07/28/2011 Elsevier Interactive Patient Education 2016 Elsevier Inc.  

## 2014-11-20 NOTE — Progress Notes (Signed)
    Subjective:  Jerry Stein is a 54 y.o. male [facility] with a chief complaint of sinus congestion.   HPI:  Sinus Cognestion Patient reports that she started having URI symptoms about 2 weeks ago including rhinorrhea, cough, sneeze, watery eyes, and sore throat. Additionally reports that she has had sinus congestion since then. Mucus is yellowish green in color. Congestion is not getting any better. No obvious alleviating or aggravating factors. Patient denies fevers, though does report hot/cold flashes. No facial pain. No chest pain or shortness of breath. Has tried nyquil, mucinex, vitamin c, and ibuprofen without relief. Has been compliant with zyrtec and flonase. Wife has the same symptoms, otherwise no sick contacts.   Hypertension BP Readings from Last 3 Encounters:  11/20/14 155/92  07/05/14 147/83  03/09/14 140/90   Home BP monitoring- No Compliant with medications-yes, without side effects ROS-Denies any CP, HA, SOB, blurry vision, LE edema, transient weakness, orthopnea, PND.   ROS: Per HPI  PMH:  The following were reviewed and entered/updated in epic: Past Medical History  Diagnosis Date  . Depression   . Anxiety   . Substance abuse 1979    once    Patient Active Problem List   Diagnosis Date Noted  . Sinusitis 11/20/2014  . Elevated blood pressure 03/09/2014  . Pes planus of both feet 01/19/2014  . Left ankle pain 01/19/2014  . Left foot pain 09/05/2013  . Snoring 07/12/2012  . Tobacco abuse 06/29/2012  . Depression 06/29/2012  . Generalized anxiety disorder 06/29/2012  . Weight gain 06/29/2012  . Left knee pain 06/29/2012  . Low back pain 06/29/2012   Past Surgical History  Procedure Laterality Date  . Knee cartilage surgery  1998  . Eye surgery Left 1976    blood clot in retina, removed, 1 month of vision loss  . Colonoscopy w/ polypectomy  2012    5 benign polyps      Objective:  Physical  Exam: BP 155/92 mmHg  Pulse 90  Temp(Src) 98.6 F (37 C) (Oral)  Wt 275 lb 12.8 oz (125.102 kg)  Gen: NAD, resting comfortably HEENT: TM clear bilaterally, MMM, O/P clear. Right maxillary sinus does not transilluminate. No LAD CV: RRR with no murmurs appreciated Lungs: NWOB, CTAB with no crackles, wheezes, or rhonchi GI: Normal bowel sounds present. Soft, Nontender, Nondistended. MSK: no edema, cyanosis, or clubbing noted Skin: warm, dry Neuro: grossly normal, moves all extremities Psych: Normal affect and thought content  Assessment/Plan:  Sinusitis Patient with 2 weeks of sinus congestion. No red flag signs or symptoms. Given prolonged course, will treat with course of augmentin. May have allergic rhinitis component - instructed patient to continue using OTC zyrtec and flonase. Return precautions reviewed.   Elevated blood pressure Noted to be elevated again today. Will defer starting anti-hypertensive therapy today. Has follow up with PCP in 2 weeks. May need to start treatment if continues to be hypertensive.    Katina Degreealeb M. Jimmey RalphParker, MD [facility] Resident PGY-2 11/20/2014 9:24 AM

## 2014-11-20 NOTE — Assessment & Plan Note (Signed)
Patient with 2 weeks of sinus congestion. No red flag signs or symptoms. Given prolonged course, will treat with course of augmentin. May have allergic rhinitis component - instructed patient to continue using OTC zyrtec and flonase. Return precautions reviewed.

## 2014-11-20 NOTE — Assessment & Plan Note (Signed)
Noted to be elevated again today. Will defer starting anti-hypertensive therapy today. Has follow up with PCP in 2 weeks. May need to start treatment if continues to be hypertensive.

## 2014-12-06 ENCOUNTER — Ambulatory Visit (INDEPENDENT_AMBULATORY_CARE_PROVIDER_SITE_OTHER): Payer: 59 | Admitting: Family Medicine

## 2014-12-06 ENCOUNTER — Encounter: Payer: Self-pay | Admitting: Family Medicine

## 2014-12-06 VITALS — BP 149/91 | HR 86 | Temp 97.6°F | Ht 78.0 in | Wt 273.0 lb

## 2014-12-06 DIAGNOSIS — M79672 Pain in left foot: Secondary | ICD-10-CM

## 2014-12-06 DIAGNOSIS — E669 Obesity, unspecified: Secondary | ICD-10-CM

## 2014-12-06 DIAGNOSIS — Z720 Tobacco use: Secondary | ICD-10-CM | POA: Diagnosis not present

## 2014-12-06 DIAGNOSIS — M654 Radial styloid tenosynovitis [de Quervain]: Secondary | ICD-10-CM | POA: Diagnosis not present

## 2014-12-06 DIAGNOSIS — Z0000 Encounter for general adult medical examination without abnormal findings: Secondary | ICD-10-CM | POA: Insufficient documentation

## 2014-12-06 LAB — CBC
HEMATOCRIT: 43.7 % (ref 39.0–52.0)
Hemoglobin: 14.3 g/dL (ref 13.0–17.0)
MCH: 28.9 pg (ref 26.0–34.0)
MCHC: 32.7 g/dL (ref 30.0–36.0)
MCV: 88.3 fL (ref 78.0–100.0)
MPV: 10.7 fL (ref 8.6–12.4)
PLATELETS: 254 10*3/uL (ref 150–400)
RBC: 4.95 MIL/uL (ref 4.22–5.81)
RDW: 14.3 % (ref 11.5–15.5)
WBC: 6.3 10*3/uL (ref 4.0–10.5)

## 2014-12-06 LAB — COMPLETE METABOLIC PANEL WITH GFR
ALBUMIN: 3.9 g/dL (ref 3.6–5.1)
ALK PHOS: 75 U/L (ref 40–115)
ALT: 15 U/L (ref 9–46)
AST: 16 U/L (ref 10–35)
BILIRUBIN TOTAL: 0.8 mg/dL (ref 0.2–1.2)
BUN: 20 mg/dL (ref 7–25)
CO2: 25 mmol/L (ref 20–31)
Calcium: 9.2 mg/dL (ref 8.6–10.3)
Chloride: 105 mmol/L (ref 98–110)
Creat: 1.12 mg/dL (ref 0.70–1.33)
GFR, EST AFRICAN AMERICAN: 86 mL/min (ref >=60)
GFR, EST NON AFRICAN AMERICAN: 74 mL/min (ref >=60)
Glucose, Bld: 81 mg/dL (ref 65–99)
POTASSIUM: 4.2 mmol/L (ref 3.5–5.3)
Sodium: 140 mmol/L (ref 135–146)
TOTAL PROTEIN: 7.1 g/dL (ref 6.1–8.1)

## 2014-12-06 LAB — LIPID PANEL
CHOLESTEROL: 183 mg/dL (ref 125–200)
HDL: 53 mg/dL (ref >=40)
LDL Cholesterol: 107 mg/dL (ref <130)
TRIGLYCERIDES: 113 mg/dL (ref <150)
Total CHOL/HDL Ratio: 3.5 Ratio (ref <=5.0)
VLDL: 23 mg/dL (ref <30)

## 2014-12-06 MED ORDER — AMITRIPTYLINE HCL 50 MG PO TABS: 50.0000 mg | ORAL_TABLET | Freq: Every day | ORAL | Status: DC

## 2014-12-06 MED ORDER — NAPROXEN 375 MG PO TABS: 375.0000 mg | ORAL_TABLET | Freq: Two times a day (BID) | ORAL | Status: DC

## 2014-12-06 NOTE — Assessment & Plan Note (Signed)
Patient was able to quit smoking after > 10 years of smoking. Quit date was in January of this year. He is been doing well although "things have been difficult at times".

## 2014-12-06 NOTE — Progress Notes (Signed)
Jerry Hillier, MD, MS Phone: [phone]  Subjective:  Chief complaint -- Annual Physical  Pt Here for physical Was seen a couple weeks ago. Was treated w/ abx for respiratory sx. Feeling better. Taking meds. No major issues. "just getting over this breathing issue".   Reports a recent left wrist injury at work. 2 days ago. Got very sore yesterday. Has not bee able to use it much since. No icing. No NSAIDs.   Colonoscopy: yes, done in [place] in 2010 or 2011 >> found 9 small polyps (benign)  Pneumonia Vaccine: no  Tobacco Use: no, quit January of this year (2016)   Alcohol Use: yes, every other weekend (2 drinks)  Other Drugs: yes, as a teen (THC and cocaine), however no use in >10 yrs   Support Structure: yes, wife  Life at Home: just he and his wife.  ROS- denies headaches, vision changes, shortness of breath, chest pain, nausea, vomiting, diarrhea, dysuria, weight loss, weight gain, heat/cold intolerance, or fatigue.  Past Medical History Patient Active Problem List   Diagnosis Date Noted  . De Quervain's tenosynovitis, left 12/06/2014  . Healthcare maintenance 12/06/2014  . Sinusitis 11/20/2014  . Elevated blood pressure 03/09/2014  . Pes planus of both feet 01/19/2014  . Left ankle pain 01/19/2014  . Left foot pain 09/05/2013  . Snoring 07/12/2012  . Tobacco abuse 06/29/2012  . Depression 06/29/2012  . Generalized anxiety disorder 06/29/2012  . Weight gain 06/29/2012  . Left knee pain 06/29/2012  . Low back pain 06/29/2012    Medications- reviewed and updated Current Outpatient Prescriptions  Medication Sig Dispense Refill  . acetaminophen (TYLENOL 8 HOUR) 650 MG CR tablet Take 1 tablet (650 mg total) by mouth every 8 (eight) hours as needed for pain.    Marland Kitchen amitriptyline (ELAVIL) 50 MG tablet Take 1 tablet (50 mg total) by mouth at bedtime. 30 tablet 2  . buPROPion (WELLBUTRIN SR) 150 MG 12 hr tablet Take 1 tablet (150 mg total) by mouth 2 (two) times daily. Take  1 pill daily for the first week, then increase to 1 pill twice daily 60 tablet 2  . DHEA 50 MG CAPS Take 1 capsule by mouth daily.    . naproxen (NAPROSYN) 375 MG tablet Take 1 tablet (375 mg total) by mouth 2 (two) times daily with a meal. 60 tablet 1   No current facility-administered medications for this visit.    Objective: BP 149/91 mmHg  Pulse 86  Temp(Src) 97.6 F (36.4 C) (Oral)  Ht  (1.981 m)  Wt 273 lb (123.832 kg)  BMI 31.55 kg/m2 Gen: NAD, alert, cooperative with exam HEENT: NCAT, EOMI, PERRL, MMM CV: RRR, good S1/S2, no murmur Resp: CTABL, no wheezes, non-labored Abd: Soft, Non Tender, Non Distended, BS present, no guarding or organomegaly Ext: No edema, warm Neuro: Alert and oriented, No gross deficits Right Wrist: Inspection w/ no visible erythema, mild swelling, no ecchymoses. Pain with active/passive generalized ROM. Palpation is without bony abnormalities over metacarpals, navicular, lunate, and TFCC; tenderness noted at the snuffbox and overlaying tendons within the first compartment.  Strength 5/5 in all directions. Only limitations are 2/2 pain. Positive Finkelstein, negative tinel's and phalens.   Assessment/Plan:  De Quervain's tenosynovitis, left Patient signs and symptoms most consistent with de Quervain's tenosynovitis. Differential includes wrist sprain and/or fracture (scaphoid). These diagnoses are unlikely due to the mechanism of injury being low impact and low velocity (sustained lifting of heavy objects). - Patient is encouraged to ice his  wrist 2-3 times every day. - Prescription for naproxen was provided. - Patient is to pickup a wrist brace with appropriate thumb immobilization. - Weight lifting restriction based on patient's pain tolerance. - At this time I do not believe it is appropriate to obtain wrist imaging. As patient's injury was low impact velocity. - Follow-up as necessary.  Next: If symptoms persist or worsen would consider  radiographs of the wrist. Also consideration for ultrasound with injection into the first compartment may also be warranted.  Tobacco abuse Patient was able to quit smoking after > 10 years of smoking. Quit date was in January of this year. He is been doing well although "things have been difficult at times".  Healthcare maintenance No current labs listed in patient's chart. We'll obtain labs today. Patient has had a colonoscopy approximately 5 years ago. He states that 9 small benign polyps were noted. We discussed that he would likely need another colonoscopy soon. Paperwork was mailed to the patient with options for practices who performed colonoscopies.    Orders Placed This Encounter  Procedures  . COMPLETE METABOLIC PANEL WITH GFR  . Lipid panel  . CBC  . TSH    Meds ordered this encounter  Medications  . amitriptyline (ELAVIL) 50 MG tablet    Sig: Take 1 tablet (50 mg total) by mouth at bedtime.    Dispense:  30 tablet    Refill:  2  . naproxen (NAPROSYN) 375 MG tablet    Sig: Take 1 tablet (375 mg total) by mouth 2 (two) times daily with a meal.    Dispense:  60 tablet    Refill:  1

## 2014-12-06 NOTE — Patient Instructions (Signed)
It was a pleasure seeing you today in our clinic. Today we discussed your wrist pain and overall health. Here is the treatment plan we have discussed and agreed upon together:   - I have ordered labs to be drawn today. I'll mail you these results and, if necessary, I will contact you personally with any changes to your medication regimen that may be required. - For your wrist pain:   - I strongly encourage you to ice this area 2-3 times a day for approximately 20 minutes.  - Take naproxen/Aleve for pain and discomfort.  - At your nearby pharmacy attempt to purchase a wrist brace which has proper stability over the thumb which we discussed in our office.  - If your wrist pain does not improve over the next 1-2 weeks please make a follow-up appointment as soon as possible so we can further workup this issue.

## 2014-12-06 NOTE — Assessment & Plan Note (Signed)
No current labs listed in patient's chart. We'll obtain labs today. Patient has had a colonoscopy approximately 5 years ago. He states that 9 small benign polyps were noted. We discussed that he would likely need another colonoscopy soon. Paperwork was mailed to the patient with options for practices who performed colonoscopies.

## 2014-12-06 NOTE — Assessment & Plan Note (Signed)
Patient signs and symptoms most consistent with de Quervain's tenosynovitis. Differential includes wrist sprain and/or fracture (scaphoid). These diagnoses are unlikely due to the mechanism of injury being low impact and low velocity (sustained lifting of heavy objects). - Patient is encouraged to ice his wrist 2-3 times every day. - Prescription for naproxen was provided. - Patient is to pickup a wrist brace with appropriate thumb immobilization. - Weight lifting restriction based on patient's pain tolerance. - At this time I do not believe it is appropriate to obtain wrist imaging. As patient's injury was low impact velocity. - Follow-up as necessary.  Next: If symptoms persist or worsen would consider radiographs of the wrist. Also consideration for ultrasound with injection into the first compartment may also be warranted.

## 2014-12-07 LAB — TSH: TSH: 1.366 u[IU]/mL (ref 0.350–4.500)

## 2014-12-13 ENCOUNTER — Other Ambulatory Visit: Payer: Self-pay | Admitting: Family Medicine

## 2014-12-13 ENCOUNTER — Encounter: Payer: Self-pay | Admitting: Family Medicine

## 2014-12-13 MED ORDER — ATORVASTATIN CALCIUM 20 MG PO TABS: 20.0000 mg | ORAL_TABLET | Freq: Every day | ORAL | Status: DC

## 2015-01-09 ENCOUNTER — Other Ambulatory Visit: Payer: Self-pay | Admitting: Family Medicine

## 2015-02-06 ENCOUNTER — Other Ambulatory Visit: Payer: Self-pay | Admitting: Family Medicine

## 2015-04-30 ENCOUNTER — Ambulatory Visit (INDEPENDENT_AMBULATORY_CARE_PROVIDER_SITE_OTHER): Payer: BC Managed Care – PPO | Admitting: Family Medicine

## 2015-04-30 ENCOUNTER — Encounter: Payer: Self-pay | Admitting: Family Medicine

## 2015-04-30 VITALS — BP 146/88 | HR 79 | Temp 98.0°F | Wt 274.6 lb

## 2015-04-30 DIAGNOSIS — M79672 Pain in left foot: Secondary | ICD-10-CM

## 2015-04-30 NOTE — Progress Notes (Signed)
Date of Visit: 04/30/2015   HPI:  Patient presents to discuss left foot pain.  Has history of arthritis of left foot. Previously treated at sports medicine with intraarticular steroid injection in January 2016 and then again here by PCP Dr. Wende MottMcKeag in June 2016. Each time, he got significant relief with the injections. He would like this again today. His pain flared about one month ago, thinks the steroid shot wore off. Pain is located on anterolateral left foot, radiates upward into knee/hip. Walks okay but sometimes feels like it "gives out". Has tried naproxen and amitriptyline but does not always take these daily.   ROS: See HPI.  PMFSH: history of depression, GAD  PHYSICAL EXAM: BP 146/88 mmHg  Pulse 79  Temp(Src) 98 F (36.7 C) (Oral)  Wt 274 lb 9.6 oz (124.558 kg) Gen: NAD, pleasant, cooperative Ext: left ankle without swelling. 2+ dp pulse on L. Full strength with ankle dorsiflexion/plantarflexion/inversion/eversion. Ankle stable without any laxity. Distal sensation intact. Mildly tender to palpation over anterolateral ankle surface.  ASSESSMENT/PLAN:  Left foot pain Patient here today primarily requesting injection of his left ankle. I am not trained to do this procedure. Attempted to find another physician here in the clinic who has experience doing ankle injections but no one was available. Offered referral back to sports medicine, but patient states he would prefer to follow up with PCP. Will make today's visit a no-charge. He'll follow up next week with PCP.    FOLLOW UP: Follow up next week with PCP for ankle joint injection.  GrenadaBrittany J. Pollie MeyerMcIntyre, MD [facility]

## 2015-04-30 NOTE — Assessment & Plan Note (Signed)
Patient here today primarily requesting injection of his left ankle. I am not trained to do this procedure. Attempted to find another physician here in the clinic who has experience doing ankle injections but no one was available. Offered referral back to sports medicine, but patient states he would prefer to follow up with PCP. Will make today's visit a no-charge. He'll follow up next week with PCP.

## 2015-04-30 NOTE — Patient Instructions (Signed)
Follow up with Dr. Wende MottMcKeag for the ankle joint injection  Be well, Dr. Pollie MeyerMcIntyre

## 2015-05-10 ENCOUNTER — Ambulatory Visit: Payer: BC Managed Care – PPO | Admitting: Family Medicine

## 2015-05-30 ENCOUNTER — Ambulatory Visit: Payer: BC Managed Care – PPO | Admitting: Family Medicine

## 2015-06-21 ENCOUNTER — Ambulatory Visit: Payer: BC Managed Care – PPO | Admitting: Family Medicine

## 2015-07-05 ENCOUNTER — Telehealth: Payer: Self-pay | Admitting: *Deleted

## 2015-07-05 ENCOUNTER — Encounter: Payer: Self-pay | Admitting: Family Medicine

## 2015-07-05 ENCOUNTER — Ambulatory Visit (INDEPENDENT_AMBULATORY_CARE_PROVIDER_SITE_OTHER): Payer: BC Managed Care – PPO | Admitting: Family Medicine

## 2015-07-05 VITALS — BP 132/88 | HR 81 | Temp 97.9°F | Ht 78.0 in | Wt 274.6 lb

## 2015-07-05 DIAGNOSIS — F32A Depression, unspecified: Secondary | ICD-10-CM

## 2015-07-05 DIAGNOSIS — M25572 Pain in left ankle and joints of left foot: Secondary | ICD-10-CM

## 2015-07-05 DIAGNOSIS — F329 Major depressive disorder, single episode, unspecified: Secondary | ICD-10-CM

## 2015-07-05 MED ORDER — METHYLPREDNISOLONE ACETATE 40 MG/ML IJ SUSP
40.0000 mg | Freq: Once | INTRAMUSCULAR | Status: AC
  Administered 2015-07-05: 40 mg via INTRA_ARTICULAR

## 2015-07-05 MED ORDER — CITALOPRAM HYDROBROMIDE 20 MG PO TABS: 20.0000 mg | ORAL_TABLET | Freq: Every day | ORAL | Status: DC

## 2015-07-05 NOTE — Addendum Note (Signed)
Addended by: Lamonte SakaiZIMMERMAN RUMPLE, APRIL D on: 07/05/2015 02:32 PM   Modules accepted: Orders

## 2015-07-05 NOTE — Assessment & Plan Note (Signed)
Worsening: Patient is here for worsening left ankle pain. Pain is chronic and gradually worsening. Pain initially located on the medial aspect of the tibiotalar joint, pain is now radiating laterally and involving the fibulotalar joint. He denies any effusion or new injury. Some occasional sensation of instability. - Intra-articular joint injection today with 30 of Kenalog. - Encouraged icing and physical therapy exercises at home.

## 2015-07-05 NOTE — Progress Notes (Signed)
HPI  CC: Left Ankle Pain Patient is here for follow-up on his left foot pain. He states that it has been gradually worsening over the past few months with a peaking pain of 8/10 sometime last month. He denies any injury or swelling to this area. He states that the pain has typically been located on the medial aspect of his left ankle across the talus. However over the past few months his pain has begun to include the lateral aspect of the ankle with some mild radiation up the lateral aspect of the lower leg. He denies any current instability but states that in the past he has felt as though his ankle has "given out on [him]". Any fever, chills, rash, injury, edema, effusion, weakness and numbness, or paresthesias.  Patient also states that his bupropion is not having the same effect it had been in the past. He is asking for "something stronger". Denies any significant depression or panic attacks. Occasionally takes his amitriptyline but is inconsistent with this use. Patient is interested in initiation of a new medication at this time.  Review of Systems   See HPI for ROS. All other systems reviewed and are negative.  CC, SH/smoking status, and VS noted  Objective: BP 132/88 mmHg  Pulse 81  Temp(Src) 97.9 F (36.6 C) (Oral)  Ht 6\' 6"  (1.981 m)  Wt 274 lb 9.6 oz (124.558 kg)  BMI 31.74 kg/m2 Gen: NAD, alert, cooperative, and pleasant. Ankle, Left: No visible erythema or swelling. Range of motion is full in all directions (slight reduction in eversion and inversion). Strength is 5/5 in all directions. Stable lateral and medial ligaments; squeeze test; Talar dome moderately tender; No pain at base of 5th MT; No tenderness over N spot or navicular prominence. Mild to moderate tenderness at the lateral and medial malleolus (nonspecific). Able to walk 4 steps. Neuro: Stable resting tremor upper extremities. Sensation intact throughout LEs.  INJECTION: Left talocrural joint (performed by Dr.  Richarda BladeAdamo) Patient was given informed consent, signed copy in the chart. Appropriate time out was taken. Area prepped and draped in usual sterile fashion. 3/4 cc of methylprednisolone 40 mg/ml plus  3 cc of 1% lidocaine without epinephrine was injected into the left talocrural joint using a(n) anteriomedial approach. The patient tolerated the procedure well. There were no complications. Post procedure instructions were given.   Assessment and plan:  Left ankle pain Worsening: Patient is here for worsening left ankle pain. Pain is chronic and gradually worsening. Pain initially located on the medial aspect of the tibiotalar joint, pain is now radiating laterally and involving the fibulotalar joint. He denies any effusion or new injury. Some occasional sensation of instability. - Intra-articular joint injection today with 30 of Kenalog. - Encouraged icing and physical therapy exercises at home.  Depression Patient continues to have symptoms of depression with associated anxiety. He states that his bupropion is not adequate at this time. Patient would like to try new therapy at this time. - Discontinue amitriptyline nightly. (Patient has been taking infrequently) - Initiate Celexa. - Follow-up in 4-6 weeks.    Meds ordered this encounter  Medications  . fluticasone (FLONASE) 50 MCG/ACT nasal spray    Sig: Place 2 sprays into both nostrils at bedtime.    Refill:  11  . citalopram (CELEXA) 20 MG tablet    Sig: Take 1 tablet (20 mg total) by mouth daily.    Dispense:  30 tablet    Refill:  1  Kathee DeltonIan D Tirso Laws, MD,MS,  PGY2 07/05/2015 10:52 AM
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
## 2015-07-05 NOTE — Assessment & Plan Note (Signed)
Patient continues to have symptoms of depression with associated anxiety. He states that his bupropion is not adequate at this time. Patient would like to try new therapy at this time. - Discontinue amitriptyline nightly. (Patient has been taking infrequently) - Initiate Celexa. - Follow-up in 4-6 weeks.

## 2015-07-05 NOTE — Patient Instructions (Signed)
It was a pleasure seeing you today in our clinic. Today we discussed your ankle pain and depression. Here is the treatment plan we have discussed and agreed upon together:   - Today we provided a left intra-articular ankle injection. As discussed in clinic if you begin to experience any symptoms of pain, redness, swelling, or the inability to move this joint seek medical attention immediately. - Do not submerge this ankle for at least 48 hours - Stopped taking the amitriptyline. - Start taking your new medication, Celexa. - I would like to have you follow-up with me in 4-6 weeks to assess your response to this new medication.

## 2015-08-08 ENCOUNTER — Other Ambulatory Visit: Payer: Self-pay | Admitting: Family Medicine

## 2015-08-15 ENCOUNTER — Other Ambulatory Visit: Payer: Self-pay | Admitting: Family Medicine

## 2016-02-17 ENCOUNTER — Telehealth: Payer: Self-pay | Admitting: Family Medicine

## 2016-02-17 NOTE — Telephone Encounter (Signed)
Appointment scheduled for 03/06/2016 at 11:30am. Overdue health maintenance items: Colonoscopy, flu vaccine, etc. Patient says he has not had colonoscopy in 8 years.

## 2016-03-06 ENCOUNTER — Ambulatory Visit (INDEPENDENT_AMBULATORY_CARE_PROVIDER_SITE_OTHER): Payer: BC Managed Care – PPO | Admitting: Family Medicine

## 2016-03-06 ENCOUNTER — Encounter: Payer: Self-pay | Admitting: Family Medicine

## 2016-03-06 VITALS — BP 134/82 | HR 72 | Temp 98.7°F | Ht 78.0 in | Wt 268.6 lb

## 2016-03-06 DIAGNOSIS — F329 Major depressive disorder, single episode, unspecified: Secondary | ICD-10-CM

## 2016-03-06 DIAGNOSIS — F32A Depression, unspecified: Secondary | ICD-10-CM

## 2016-03-06 DIAGNOSIS — Z20828 Contact with and (suspected) exposure to other viral communicable diseases: Secondary | ICD-10-CM | POA: Diagnosis not present

## 2016-03-06 DIAGNOSIS — Z79899 Other long term (current) drug therapy: Secondary | ICD-10-CM | POA: Diagnosis not present

## 2016-03-06 DIAGNOSIS — R030 Elevated blood-pressure reading, without diagnosis of hypertension: Secondary | ICD-10-CM

## 2016-03-06 LAB — LIPID PANEL
CHOL/HDL RATIO: 3.5 ratio (ref <5.0)
Cholesterol: 212 mg/dL — ABNORMAL HIGH (ref <200)
HDL: 60 mg/dL (ref >40)
LDL Cholesterol: 136 mg/dL — ABNORMAL HIGH (ref <100)
TRIGLYCERIDES: 81 mg/dL (ref <150)
VLDL: 16 mg/dL (ref <30)

## 2016-03-06 LAB — CBC WITH DIFFERENTIAL/PLATELET
BASOS ABS: 0 {cells}/uL (ref 0–200)
BASOS PCT: 0 %
EOS ABS: 135 {cells}/uL (ref 15–500)
Eosinophils Relative: 3 %
HEMATOCRIT: 44.4 % (ref 38.5–50.0)
HEMOGLOBIN: 14.6 g/dL (ref 13.2–17.1)
LYMPHS ABS: 2295 {cells}/uL (ref 850–3900)
Lymphocytes Relative: 51 %
MCH: 29.2 pg (ref 27.0–33.0)
MCHC: 32.9 g/dL (ref 32.0–36.0)
MCV: 88.8 fL (ref 80.0–100.0)
MONO ABS: 450 {cells}/uL (ref 200–950)
MPV: 10.1 fL (ref 7.5–12.5)
Monocytes Relative: 10 %
NEUTROS ABS: 1620 {cells}/uL (ref 1500–7800)
Neutrophils Relative %: 36 %
Platelets: 188 10*3/uL (ref 140–400)
RBC: 5 MIL/uL (ref 4.20–5.80)
RDW: 14.5 % (ref 11.0–15.0)
WBC: 4.5 10*3/uL (ref 3.8–10.8)

## 2016-03-06 LAB — BASIC METABOLIC PANEL WITH GFR
BUN: 15 mg/dL (ref 7–25)
CALCIUM: 9.2 mg/dL (ref 8.6–10.3)
CO2: 25 mmol/L (ref 20–31)
Chloride: 108 mmol/L (ref 98–110)
Creat: 0.89 mg/dL (ref 0.70–1.33)
GFR, Est Non African American: >89 mL/min (ref >=60)
Glucose, Bld: 66 mg/dL (ref 65–99)
POTASSIUM: 4.8 mmol/L (ref 3.5–5.3)
SODIUM: 142 mmol/L (ref 135–146)

## 2016-03-06 MED ORDER — FLUTICASONE PROPIONATE 50 MCG/ACT NA SUSP: 2.0000 | Freq: Every day | NASAL | 11 refills | Status: DC

## 2016-03-06 MED ORDER — ATORVASTATIN CALCIUM 20 MG PO TABS: 20.0000 mg | ORAL_TABLET | Freq: Every day | ORAL | 3 refills | Status: DC

## 2016-03-06 MED ORDER — AMITRIPTYLINE HCL 50 MG PO TABS
50.0000 mg | ORAL_TABLET | Freq: Every day | ORAL | 5 refills | Status: DC
Start: 2016-03-06 — End: 2017-09-02

## 2016-03-06 NOTE — Assessment & Plan Note (Signed)
Patient states he is been feeling well on his current medication regimen. No changes at this time. - Continue bupropion and Celexa at current dosing

## 2016-03-06 NOTE — Assessment & Plan Note (Signed)
Improved: Patient continues to stay in the prehypertensive range. No red flag symptoms this time. No reason for treatment at this time. - Continue current medication regimen - Obtain labs: Lipid panel, CBC, BMP

## 2016-03-06 NOTE — Progress Notes (Signed)
   HPI  CC: Medication refill Patient is here for checkup and medication refill. He states that he feels well. I typically see patient for bilateral ankle pain but he states that since his most recent intra-articular steroid injection that his pain has been very well controlled. He endorses good compliance with his medications at this time. He has no new issues to report. He states that he has been trying to lose weight and it appears as though patient has lost 6 pounds since our last visit. No new worsening with his depression or anxiety, no SI/HI.  Patient denies no changes in his appetite. Denies fever, chills, vision changes, balance issues, lightheadedness, shortness of breath, chest pain, nausea, vomiting, diarrhea, paresthesias, weakness, or numbness.  Review of Systems See HPI for ROS.   CC, SH/smoking status, and VS noted  Objective: BP 134/82   Pulse 72   Temp 98.7 F (37.1 C) (Oral)   Ht 6\' 6"  (1.981 m)   Wt 268 lb 9.6 oz (121.8 kg)   SpO2 99%   BMI 31.04 kg/m  Gen: NAD, alert, cooperative, and pleasant. HEENT: NCAT, EOMI, PERRL CV: RRR, no murmur Resp: CTAB, no wheezes, non-labored Abd: SNTND, BS present, no guarding or organomegaly Ext: No edema, warm, strength 5/5 bilaterally, dorsalis pedis and posterior tibialis pulses intact and equal bilaterally, no gait abnormalities. Neuro: Alert and oriented, Speech clear, No gross deficits   Assessment and plan:  Depression Patient states he is been feeling well on his current medication regimen. No changes at this time. - Continue bupropion and Celexa at current dosing  Elevated blood-pressure reading without diagnosis of hypertension Improved: Patient continues to stay in the prehypertensive range. No red flag symptoms this time. No reason for treatment at this time. - Continue current medication regimen - Obtain labs: Lipid panel, CBC, BMP   Orders Placed This Encounter  Procedures  . BASIC METABOLIC PANEL WITH  GFR  . Lipid panel  . Hepatitis C antibody  . HIV antibody  . CBC with Differential/Platelet    Meds ordered this encounter  Medications  . amitriptyline (ELAVIL) 50 MG tablet    Sig: Take 1 tablet (50 mg total) by mouth at bedtime.    Dispense:  30 tablet    Refill:  5  . atorvastatin (LIPITOR) 20 MG tablet    Sig: Take 1 tablet (20 mg total) by mouth daily.    Dispense:  90 tablet    Refill:  3  . fluticasone (FLONASE) 50 MCG/ACT nasal spray    Sig: Place 2 sprays into both nostrils at bedtime.    Dispense:  16 g    Refill:  11     Kathee DeltonIan D McKeag, MD,MS,  PGY3 03/06/2016 12:24 PM

## 2016-03-06 NOTE — Patient Instructions (Signed)
It was a pleasure seeing you today in our clinic. Today we discussed your health, blood tests, and your ankle pain. Here is the treatment plan we have discussed and agreed upon together:   - We have obtained blood tests today. I will mail you the results of these. - If your ankle pain begins to worsen please make an appointment to have these injected again.

## 2016-03-07 LAB — HEPATITIS C ANTIBODY: HCV Ab: NEGATIVE

## 2016-03-07 LAB — HIV ANTIBODY (ROUTINE TESTING W REFLEX): HIV 1&2 Ab, 4th Generation: NONREACTIVE

## 2016-03-10 ENCOUNTER — Encounter: Payer: Self-pay | Admitting: Family Medicine

## 2016-03-10 ENCOUNTER — Other Ambulatory Visit: Payer: Self-pay | Admitting: Family Medicine

## 2016-03-10 MED ORDER — ATORVASTATIN CALCIUM 40 MG PO TABS: 40.0000 mg | ORAL_TABLET | Freq: Every day | ORAL | 3 refills | Status: DC

## 2016-03-13 ENCOUNTER — Encounter: Payer: Self-pay | Admitting: *Deleted

## 2017-05-25 ENCOUNTER — Encounter: Payer: Self-pay | Admitting: Family Medicine

## 2017-05-25 ENCOUNTER — Other Ambulatory Visit: Payer: Self-pay

## 2017-05-25 ENCOUNTER — Ambulatory Visit (INDEPENDENT_AMBULATORY_CARE_PROVIDER_SITE_OTHER): Payer: Self-pay | Admitting: Family Medicine

## 2017-05-25 DIAGNOSIS — M791 Myalgia, unspecified site: Secondary | ICD-10-CM | POA: Insufficient documentation

## 2017-05-25 DIAGNOSIS — I10 Essential (primary) hypertension: Secondary | ICD-10-CM

## 2017-05-25 HISTORY — DX: Essential (primary) hypertension: I10
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
## 2017-05-25 NOTE — Progress Notes (Signed)
Subjective  Jerry Stein is a 57 y.o. male is presenting with the following  Chief Complaint noted Review of Symptoms - see HPI PMH - Smoking status noted.    Objective Vital Signs reviewed BP (!) 178/110   Pulse 82   Temp 98.3 F (36.8 C) (Oral)   Ht  (1.981 m)   Wt 267 lb 3.2 oz (121.2 kg)   SpO2 99%   BMI 30.88 kg/m   Assessments/Plans  See after visit summary for details of patient instuctions  No problem-specific Assessment & Plan notes found for this encounter.
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
## 2017-06-09 ENCOUNTER — Other Ambulatory Visit: Payer: Self-pay

## 2017-06-09 ENCOUNTER — Ambulatory Visit (INDEPENDENT_AMBULATORY_CARE_PROVIDER_SITE_OTHER): Payer: Self-pay | Admitting: Family Medicine

## 2017-06-09 ENCOUNTER — Encounter: Payer: Self-pay | Admitting: Family Medicine

## 2017-06-09 VITALS — BP 152/98 | HR 63 | Temp 97.6°F | Wt 268.0 lb

## 2017-06-09 DIAGNOSIS — Z13220 Encounter for screening for lipoid disorders: Secondary | ICD-10-CM

## 2017-06-09 DIAGNOSIS — Z720 Tobacco use: Secondary | ICD-10-CM

## 2017-06-09 DIAGNOSIS — I10 Essential (primary) hypertension: Secondary | ICD-10-CM

## 2017-06-09 DIAGNOSIS — E785 Hyperlipidemia, unspecified: Secondary | ICD-10-CM | POA: Insufficient documentation

## 2017-06-09 HISTORY — DX: Hyperlipidemia, unspecified: E78.5

## 2017-06-09 MED ORDER — LISINOPRIL 10 MG PO TABS: 10.0000 mg | ORAL_TABLET | Freq: Every day | ORAL | 3 refills | Status: DC

## 2017-06-09 NOTE — Patient Instructions (Addendum)
IIt was good to see you today.  Thank you for coming in.  Medication Changes Today Keep taking the lisinopril 10 mg every day  Homework - Get an automatic BP cuff - around $40 - Stop the tylenol combination medication and just take Tylenol 2 xtra strength three times a day as needed - Record your blood pressure - Please write down readings - If you blood pressure is regularly > 160/100 then call us - cut back on salt and salty foods as much as possible  Tests you need to stay Healthy - Blood test today to check your kidney function  Future Work Stop Smoking Colonoscopy   You should make an appointment to see Dr Abelardo DieselMcMullen or myself in 2 weeks  Be Well
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
## 2017-06-09 NOTE — Assessment & Plan Note (Signed)
Not at goal despite him taking his wife's lisinopril.  Check labs, get blood pressure monitor and continue lisinopril cut back on salt and stop combo pain medication.   Close follow up will likely need increased medication
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
## 2017-06-09 NOTE — Assessment & Plan Note (Signed)
Wants to stop.  Discussed once blood pressure under better control will work with this.  He agrees

## 2017-06-10 LAB — CMP14+EGFR
A/G RATIO: 1.9 (ref 1.2–2.2)
ALBUMIN: 4.3 g/dL (ref 3.5–5.5)
ALT: 21 IU/L (ref 0–44)
AST: 18 IU/L (ref 0–40)
Alkaline Phosphatase: 68 IU/L (ref 39–117)
BUN / CREAT RATIO: 14 (ref 9–20)
BUN: 15 mg/dL (ref 6–24)
Bilirubin Total: 0.4 mg/dL (ref 0.0–1.2)
CALCIUM: 9.6 mg/dL (ref 8.7–10.2)
CHLORIDE: 107 mmol/L — AB (ref 96–106)
CO2: 24 mmol/L (ref 20–29)
Creatinine, Ser: 1.08 mg/dL (ref 0.76–1.27)
GFR, EST AFRICAN AMERICAN: 88 mL/min/{1.73_m2} (ref >59)
GFR, EST NON AFRICAN AMERICAN: 76 mL/min/{1.73_m2} (ref >59)
GLOBULIN, TOTAL: 2.3 g/dL (ref 1.5–4.5)
Glucose: 94 mg/dL (ref 65–99)
POTASSIUM: 5.3 mmol/L — AB (ref 3.5–5.2)
SODIUM: 145 mmol/L — AB (ref 134–144)
TOTAL PROTEIN: 6.6 g/dL (ref 6.0–8.5)

## 2017-06-10 LAB — LIPID PANEL
CHOL/HDL RATIO: 4.3 ratio (ref 0.0–5.0)
Cholesterol, Total: 195 mg/dL (ref 100–199)
HDL: 45 mg/dL (ref >39)
LDL Calculated: 139 mg/dL — ABNORMAL HIGH (ref 0–99)
Triglycerides: 56 mg/dL (ref 0–149)
VLDL Cholesterol Cal: 11 mg/dL (ref 5–40)

## 2017-06-18 ENCOUNTER — Telehealth: Payer: Self-pay

## 2017-06-18 NOTE — Telephone Encounter (Signed)
Patient called nurse line and reports he is taking Lisinopril 10 mg daily. He bought a BP cuff and his readings are running 165-175 over 80-102. Wants to know if he should increase Lisinopril to 2 daily.  Call back is [phone]  Ples SpecterAlisa Brake, RN [facility] RN)

## 2017-06-21 NOTE — Telephone Encounter (Signed)
Please call him and see if he can come see me tomorrow or Wed AM.  We will likely need to start a different medication for his blood pressure  Please ask him to bring in all his medications and his blood pressure machine  Thanks

## 2017-06-21 NOTE — Telephone Encounter (Signed)
Pt informed and scheduled for an appt. Deseree Blount, CMA  

## 2017-06-23 ENCOUNTER — Encounter: Payer: Self-pay | Admitting: Family Medicine

## 2017-06-23 ENCOUNTER — Ambulatory Visit (INDEPENDENT_AMBULATORY_CARE_PROVIDER_SITE_OTHER): Payer: Self-pay | Admitting: Family Medicine

## 2017-06-23 ENCOUNTER — Other Ambulatory Visit: Payer: Self-pay

## 2017-06-23 DIAGNOSIS — I10 Essential (primary) hypertension: Secondary | ICD-10-CM

## 2017-06-23 DIAGNOSIS — E785 Hyperlipidemia, unspecified: Secondary | ICD-10-CM

## 2017-06-23 DIAGNOSIS — Z720 Tobacco use: Secondary | ICD-10-CM

## 2017-06-23 MED ORDER — ATORVASTATIN CALCIUM 40 MG PO TABS: 40.0000 mg | ORAL_TABLET | Freq: Every day | ORAL | 3 refills | Status: DC

## 2017-06-23 MED ORDER — BUPROPION HCL ER (SR) 150 MG PO TB12: 150.0000 mg | ORAL_TABLET | Freq: Two times a day (BID) | ORAL | 3 refills | Status: DC

## 2017-06-23 NOTE — Assessment & Plan Note (Signed)
Unsure if at goal.  See after visit summary.  If readings continue high would start indapamide

## 2017-06-23 NOTE — Assessment & Plan Note (Signed)
Unchanged See after visit summary  

## 2017-06-23 NOTE — Assessment & Plan Note (Signed)
Not controlled.  Restart lipitor

## 2017-06-23 NOTE — Progress Notes (Signed)
Subjective  Jerry Stein is a 57 y.o. male is presenting with the following  HYPERTENSION Taking lisinopril one daily.  Did not bring his wrist cuff.  Readings that he remembers are a broad range.  No chest pain or edema.  Mild lightheadness when stands quickly.  No falls  SMOKING Smokes 4 per day at varying times.  He and his wife would like to stop.  He buys his own cigs.  He was able to stop for 4 years before he moved to [place].  He can't remember if buproprion helped or not.  Would like to try again.  No sputum or shortness of breath   HYPERLIPIDEMIA Symptoms Chest pain on exertion:  no   Leg claudication:   no Medications (modifying factor): Compliance- has not taken  lipitor for months.  Would like to restart.  Right upper quadrant pain- no  Muscle aches- no Duration - years   Timing - continuous    Component Value Date/Time   CHOL 195 06/09/2017 0855   TRIG 56 06/09/2017 0855   HDL 45 06/09/2017 0855   VLDL 16 03/06/2016 0001   CHOLHDL 4.3 06/09/2017 0855   CHOLHDL 3.5 03/06/2016 0001    Chief Complaint noted Review of Symptoms - see HPI PMH - Smoking status noted.    Objective Vital Signs reviewed BP (!) 142/88   Pulse 64   Temp 97.7 F (36.5 C) (Oral)   Ht 6\' 6"  (1.981 m)   Wt 266 lb 6.4 oz (120.8 kg)   SpO2 99%   BMI 30.79 kg/m  Psych:  Cognition and judgment appear intact. Alert, communicative  and cooperative with normal attention span and concentration. No apparent delusions, illusions, hallucinations   Assessments/Plans  See after visit summary for details of patient instuctions  High blood pressure Unsure if at goal.  See after visit summary.  If readings continue high would start indapamide   Tobacco abuse Unchanged.  See after visit summary   Hyperlipidemia Not controlled.  Restart lipitor

## 2017-06-23 NOTE — Patient Instructions (Addendum)
Good to see you today!  Thanks for coming in.  For Smoking - find a substitute and consider not buying  Restart the lipitor every day  Monitor your blood pressure - consider a arm blood pressure  - make sure come with different size cuffs Write down the blood pressure readings   Bring all your medication bottles and your blood pressure meter next visit

## 2017-07-01 ENCOUNTER — Encounter: Payer: Self-pay | Admitting: Family Medicine

## 2017-07-01 ENCOUNTER — Ambulatory Visit (INDEPENDENT_AMBULATORY_CARE_PROVIDER_SITE_OTHER): Payer: Self-pay | Admitting: Family Medicine

## 2017-07-01 ENCOUNTER — Other Ambulatory Visit: Payer: Self-pay

## 2017-07-01 VITALS — BP 110/62 | HR 78 | Temp 98.1°F | Ht 78.0 in | Wt 266.8 lb

## 2017-07-01 DIAGNOSIS — I10 Essential (primary) hypertension: Secondary | ICD-10-CM

## 2017-07-01 DIAGNOSIS — F411 Generalized anxiety disorder: Secondary | ICD-10-CM

## 2017-07-01 DIAGNOSIS — Z1211 Encounter for screening for malignant neoplasm of colon: Secondary | ICD-10-CM

## 2017-07-01 DIAGNOSIS — F17200 Nicotine dependence, unspecified, uncomplicated: Secondary | ICD-10-CM

## 2017-07-01 MED ORDER — CITALOPRAM HYDROBROMIDE 20 MG PO TABS: 20.0000 mg | ORAL_TABLET | Freq: Every day | ORAL | 0 refills | Status: DC

## 2017-07-01 NOTE — Assessment & Plan Note (Signed)
Chronic.  Controlled with lisinopril.  Current everyday smoker. - Continue lisinopril 10 mg daily - Discussed importance of smoking cessation
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
## 2017-07-01 NOTE — Assessment & Plan Note (Addendum)
Chronic.  Interested in cessation.  One prior attempt with Chantix.  Uninsured. - Congratulated patient on interested in cessation and discussed importance - Given number for 1 800 quit now

## 2017-07-01 NOTE — Assessment & Plan Note (Signed)
Has prior colonoscopy approximately 8 years ago in [place] with benign benign polyps.  Patient without history of rectal bleeding or concerns for malignancy.  No family history of colorectal cancer.  Patient is uninsured. - Given colonoscopy form with instructions to contact local GI office of his choice to schedule screening colonoscopy - Discussed importance of smoking cessation
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
## 2017-08-12 ENCOUNTER — Ambulatory Visit: Payer: Self-pay | Admitting: Family Medicine

## 2017-09-02 ENCOUNTER — Encounter: Payer: Self-pay | Admitting: Family Medicine

## 2017-09-02 ENCOUNTER — Ambulatory Visit (INDEPENDENT_AMBULATORY_CARE_PROVIDER_SITE_OTHER): Payer: Self-pay | Admitting: Family Medicine

## 2017-09-02 VITALS — BP 115/60 | HR 91 | Temp 97.8°F | Wt 267.0 lb

## 2017-09-02 DIAGNOSIS — I10 Essential (primary) hypertension: Secondary | ICD-10-CM

## 2017-09-02 DIAGNOSIS — R059 Cough, unspecified: Secondary | ICD-10-CM

## 2017-09-02 DIAGNOSIS — F411 Generalized anxiety disorder: Secondary | ICD-10-CM

## 2017-09-02 DIAGNOSIS — F17200 Nicotine dependence, unspecified, uncomplicated: Secondary | ICD-10-CM

## 2017-09-02 DIAGNOSIS — R05 Cough: Secondary | ICD-10-CM

## 2017-09-02 DIAGNOSIS — G8929 Other chronic pain: Secondary | ICD-10-CM

## 2017-09-02 DIAGNOSIS — M25512 Pain in left shoulder: Secondary | ICD-10-CM

## 2017-09-02 MED ORDER — DULOXETINE HCL 60 MG PO CPEP: 60.0000 mg | ORAL_CAPSULE | Freq: Every day | ORAL | 3 refills | Status: DC

## 2017-09-02 MED ORDER — LOSARTAN POTASSIUM 50 MG PO TABS: 50.0000 mg | ORAL_TABLET | Freq: Every day | ORAL | 3 refills | Status: DC

## 2017-09-02 NOTE — Patient Instructions (Signed)
Thank you for coming in to see us today. Please see below to review our plan for today's visit.  We have made several medication changes. 1.  Discontinue the Celexa and amitriptyline.  I will substitute these medications for a better medication which can treat both your back pain and her anxiety called Cymbalta.  Take this medication daily.  I will have you follow-up in 1 month. 2.  We will see if the Cymbalta improves her left shoulder pain.  I do believe that this is related to a muscular issue. 3.  You are overdue for your colonoscopy.  I have given you a form with 3 separate numbers which you can call to schedule a screening colonoscopy.  This is 1 of the few cancers you can prevent and is a leading cause of cancer death.  Please call the clinic at [phone] if your symptoms worsen or you have any concerns. It was our pleasure to serve you.  Durward Parcelavid McMullen, DO [facility], PGY-3
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
## 2017-09-03 ENCOUNTER — Encounter: Payer: Self-pay | Admitting: Family Medicine

## 2017-09-03 DIAGNOSIS — G8929 Other chronic pain: Secondary | ICD-10-CM | POA: Insufficient documentation

## 2017-09-03 DIAGNOSIS — M25512 Pain in left shoulder: Secondary | ICD-10-CM

## 2017-09-03 NOTE — Assessment & Plan Note (Signed)
Chronic.  Well-controlled.  Does have associated cough likely from ACE inhibitor use of an onset just after initiating lisinopril. - Switching lisinopril 10 mg daily to losartan 50 mg daily
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
## 2017-09-03 NOTE — Assessment & Plan Note (Addendum)
Chronic.  Well-controlled on Celexa, however not taking scheduled.  Gad 7 score 6, somewhat difficult.  Given patient's chronic back pain unresponsive to amitriptyline, patient interested in transitioning to Cymbalta. - Discontinue Celexa 20 mg daily and amitriptyline 50 mg daily - Initiating Cymbalta 60 mg daily - RTC 1 month or sooner if needed

## 2017-09-03 NOTE — Assessment & Plan Note (Signed)
Chronic.  Interested in cessation.  No insurance.  Currently smoking one third of a pack per day. - Discussed smoking cessation

## 2017-10-04 ENCOUNTER — Ambulatory Visit (INDEPENDENT_AMBULATORY_CARE_PROVIDER_SITE_OTHER): Payer: Self-pay | Admitting: Family Medicine

## 2017-10-04 ENCOUNTER — Encounter: Payer: Self-pay | Admitting: Family Medicine

## 2017-10-04 VITALS — BP 115/60 | HR 87 | Temp 98.4°F | Wt 272.6 lb

## 2017-10-04 DIAGNOSIS — Z1212 Encounter for screening for malignant neoplasm of rectum: Secondary | ICD-10-CM

## 2017-10-04 DIAGNOSIS — Z1211 Encounter for screening for malignant neoplasm of colon: Secondary | ICD-10-CM

## 2017-10-04 DIAGNOSIS — M25512 Pain in left shoulder: Secondary | ICD-10-CM

## 2017-10-04 DIAGNOSIS — F411 Generalized anxiety disorder: Secondary | ICD-10-CM

## 2017-10-04 DIAGNOSIS — G8929 Other chronic pain: Secondary | ICD-10-CM
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
## 2017-10-04 NOTE — Assessment & Plan Note (Signed)
Chronic.  Well-controlled is transitioning to Cymbalta due to concomitant low back pain.  PHQ-9 score 4, GAD-7 score 4. - Continue Cymbalta 60 mg daily - RTC 2 months or sooner if needed
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
## 2017-10-04 NOTE — Patient Instructions (Signed)
Thank you for coming in to see [place] today. Please see below to review our plan for today's visit.  I am pleased to hear that Cymbalta is helping.  Continue taking this and follow-up with me in about 2 months. If you continue having pain, we can proceed with the next step with a referral if needed.  Please call the clinic at [phone] if your symptoms worsen or you have any concerns. It was our pleasure to serve you.  Durward Parcel, DO [facility], PGY-3

## 2018-01-11 ENCOUNTER — Other Ambulatory Visit: Payer: Self-pay | Admitting: *Deleted

## 2018-01-11 ENCOUNTER — Other Ambulatory Visit: Payer: Self-pay | Admitting: Family Medicine

## 2018-01-11 DIAGNOSIS — I10 Essential (primary) hypertension: Secondary | ICD-10-CM

## 2018-01-11 MED ORDER — LOSARTAN POTASSIUM 50 MG PO TABS: 50.0000 mg | ORAL_TABLET | Freq: Every day | ORAL | 3 refills | Status: DC

## 2018-01-11 NOTE — Telephone Encounter (Signed)
There are none. Please advise.  Durward Parcel, DO [facility], PGY-3

## 2018-01-11 NOTE — Telephone Encounter (Signed)
Patient needs should have his kidney function check after starting Losartan within 4 weeks after starting. Order placed. Please advise he can come in 1 month from beginning med.  Durward Parcel, DO [facility] Family [facility], PGY-3

## 2018-01-11 NOTE — Telephone Encounter (Signed)
Pt has been taking wellbutrin and losartan for "a while" but he wants to make sure there are no interactions. [place], Maryjo [place], CMA

## 2018-07-15 ENCOUNTER — Other Ambulatory Visit: Payer: Self-pay

## 2018-07-15 ENCOUNTER — Telehealth (INDEPENDENT_AMBULATORY_CARE_PROVIDER_SITE_OTHER): Payer: Self-pay | Admitting: Family Medicine

## 2018-07-15 ENCOUNTER — Other Ambulatory Visit: Payer: Self-pay | Admitting: Family Medicine

## 2018-07-15 DIAGNOSIS — R058 Other specified cough: Secondary | ICD-10-CM

## 2018-07-15 DIAGNOSIS — I10 Essential (primary) hypertension: Secondary | ICD-10-CM

## 2018-07-15 DIAGNOSIS — R05 Cough: Secondary | ICD-10-CM

## 2018-07-15 MED ORDER — CETIRIZINE HCL 10 MG PO TABS: 10.0000 mg | ORAL_TABLET | Freq: Every day | ORAL | 1 refills | Status: DC

## 2018-07-15 MED ORDER — LOSARTAN POTASSIUM 50 MG PO TABS: 50.0000 mg | ORAL_TABLET | Freq: Every day | ORAL | 3 refills | Status: DC

## 2018-07-15 NOTE — Progress Notes (Signed)
Last Saturday started while cleaning Using aerosol sprays such as [place] Citron, then changed filters People were cutting grass around Thinks he breathed in these chemicals Has been taking Nyquil and Dayquil with some relief Had itchy eyes in the beginning which has resolved Has lost his appetite Some shortness of breath with activity on Monday July 6 Subjective fever on Wednesday  No other cold sx No known exposure to COVID Dry cough  [place] Telemedicine Visit  Patient consented to have virtual visit. Method of visit: Telephone  Encounter participants: Patient: Jerry Stein - located at home Provider: Kathrene Alu - located at [facility] Others (if applicable): none  Chief Complaint: cough  HPI:  Patient reports that last Saturday, July 4, he developed a dry cough after cleaning his house with a aerosolized spray such as her breeze and changing filters.  He also says that people were mowing the lawns surrounding his house.  He believes that he breathed in some of these fumes and allergens and that is why he has been coughing.  He has been taking NyQuil and DayQuil, which have given him some relief.  He also had itchy eyes at around the time that his symptoms started, but this is since resolved.  He had some shortness of breath while climbing stairs on Monday, July 6, but he has had resolution of that as well.  He denies other cold symptoms, although he does say he has had a reduction in his appetite in the past week.  He says that he may have felt a subjective fever on Wednesday, July 8.  He has no known exposures to COVID or anyone symptomatic for an upper respiratory infection.  ROS: per HPI  Pertinent PMHx: Hypertension, anxiety, tobacco use  Exam:  Respiratory: Patient able to speak in full sentences without audible shortness of breath, no coughing heard during the visit  Assessment/Plan:  Dry cough Patient's cough could be due to allergen exposure,  irritation of the airways due to inhalation of fumes 1 week ago, a viral infection, or due to cigarette smoking.  Since patient's symptoms seem to largely be improving, we will see how he responds to a course of Zyrtec once daily.  Also encouraged patient to drink plenty of fluids during the day and to eat whenever he can tolerate it.  Since patient does not currently have shortness of breath or fever, utility of COVID testing is low, so this is not recommended.  He was asked to monitor his symptoms and let us know if he does not have any improvement over the following week and if he develops worsening shortness of breath and fever.  He was counseled that cough may last several weeks.    Time spent during visit with patient: 9 minutes

## 2018-07-15 NOTE — Assessment & Plan Note (Signed)
Patient's cough could be due to allergen exposure, irritation of the airways due to inhalation of fumes 1 week ago, a viral infection, or due to cigarette smoking.  Since patient's symptoms seem to largely be improving, we will see how he responds to a course of Zyrtec once daily.  Also encouraged patient to drink plenty of fluids during the day and to eat whenever he can tolerate it.  Since patient does not currently have shortness of breath or fever, utility of COVID testing is low, so this is not recommended.  He was asked to monitor his symptoms and let us know if he does not have any improvement over the following week and if he develops worsening shortness of breath and fever.  He was counseled that cough may last several weeks.

## 2018-12-19 ENCOUNTER — Other Ambulatory Visit: Payer: Self-pay

## 2018-12-19 ENCOUNTER — Telehealth (INDEPENDENT_AMBULATORY_CARE_PROVIDER_SITE_OTHER): Payer: Self-pay | Admitting: Family Medicine

## 2018-12-19 DIAGNOSIS — Z713 Dietary counseling and surveillance: Secondary | ICD-10-CM

## 2018-12-19 DIAGNOSIS — R634 Abnormal weight loss: Secondary | ICD-10-CM

## 2018-12-19 DIAGNOSIS — R42 Dizziness and giddiness: Secondary | ICD-10-CM

## 2018-12-19 DIAGNOSIS — R232 Flushing: Secondary | ICD-10-CM

## 2018-12-19 DIAGNOSIS — R05 Cough: Secondary | ICD-10-CM

## 2018-12-19 NOTE — Progress Notes (Signed)
[place] Telemedicine Visit  Patient arrived for in-person visit but was found to be coughing and was sent home to complete telemedicine visit at scheduled time. Patient agreed to this.   I connected with Jerry Stein 's on 12/19/18 at  4:00 PM EST by a video enabled telemedicine application and verified that I am speaking with the correct person using two identifiers.     I discussed the limitations of evaluation and management by telemedicine and the availability of in person appointments.  I discussed that the purpose of this telehealth visit is to provide medical care while limiting exposure to the novel coronavirus. The patient expressed understanding and agreed to proceed.  Patient consented to have virtual visit. Method of visit: Video was attempted, but technology challenges prevented patient from using video, so visit was conducted via telephone.  Encounter participants: Patient: Jerry Stein - located at [place] Provider: Danna Hefty - located at [facility] Others (if applicable): None  Chief Complaint: Desire for weight loss  HPI: Patient notes desire to lose weight. Last weight 3 months ago, per patient was 258lbs. Last weight in 09/2017 was 272lbs. He is not doing much to lose weight but he is happy about this. He does not exercise. He works as a Dealer. He notes he eats lots of fried foods - pork, chicken, fish. He notes he drinks a lot of sodas as well (coke, Dr. Malachi Bonds). He has some family history of diabetes but no first degree relatives. Patient was interested in buying Hydroxycut but was not sure it was safe to take with his current medications.   ROS: per HPI  Pertinent PMHx:   Exam:  Respiratory: Speaking in full sentences, unlabored breathing. No coughing appreciated during exam.  Assessment/Plan:  Encounter for weight loss counseling: Patient is interested in losing weight. Per patient's report and last documented weight, appears  patient has lost 14lbs already. It is difficult for him to exercise due to his work schedule, although he does stay active with his job. He does endorse a relatively unhealthy diet. Discussed modification that could improve his weight such as choosing more baked or grilled foods as well as cutting down on sodas. He noted motivation to cut down to 1 soda a day. He plans to start working on this. Discussed interest in using Hydroxycut. Informed him that given these supplements are not well studied or FDA approved I am unable to provide a recommendation for or against this medication. I did advice him to be cautious with over the counter supplements and to research prior to starting. He understood and agreed to plan. - Will continue to discuss in future visits - consider nutrition consult in the future - will obtain updated weight at follow up visit to better assess weight gain/loss  Vertigo: Patient also endorsed dizziness when he turns over in bed and when he stands up. Informed him that this would be better addressed during an in-person visit. He understood and agreed to schedule follow up visit to further evaluate this. Appointment scheduled for 01/02/19.    Time spent during visit with patient: 15 minutes   Danna Hefty, [place], PGY2 12/19/18

## 2019-01-02 ENCOUNTER — Other Ambulatory Visit: Payer: Self-pay | Admitting: Family Medicine

## 2019-01-02 ENCOUNTER — Ambulatory Visit: Payer: Self-pay | Admitting: Family Medicine

## 2019-01-02 MED ORDER — BUPROPION HCL ER (SR) 150 MG PO TB12: 150.0000 mg | ORAL_TABLET | Freq: Two times a day (BID) | ORAL | 3 refills | Status: DC

## 2019-01-02 NOTE — Telephone Encounter (Signed)
Pt is calling for a refill on his Wellbutrin. jw

## 2019-03-10 ENCOUNTER — Emergency Department (HOSPITAL_COMMUNITY)
Admission: EM | Admit: 2019-03-10 | Discharge: 2019-03-10 | Disposition: A | Discharge status: Home / Self Care | Payer: Self-pay | Attending: Emergency Medicine | Admitting: Emergency Medicine

## 2019-03-10 ENCOUNTER — Emergency Department (HOSPITAL_COMMUNITY): Payer: Self-pay

## 2019-03-10 ENCOUNTER — Encounter (HOSPITAL_COMMUNITY): Payer: Self-pay | Admitting: Emergency Medicine

## 2019-03-10 DIAGNOSIS — R509 Fever, unspecified: Secondary | ICD-10-CM | POA: Insufficient documentation

## 2019-03-10 DIAGNOSIS — J189 Pneumonia, unspecified organism: Secondary | ICD-10-CM | POA: Insufficient documentation

## 2019-03-10 DIAGNOSIS — Z79899 Other long term (current) drug therapy: Secondary | ICD-10-CM | POA: Insufficient documentation

## 2019-03-10 DIAGNOSIS — R0602 Shortness of breath: Secondary | ICD-10-CM | POA: Insufficient documentation

## 2019-03-10 DIAGNOSIS — Z87891 Personal history of nicotine dependence: Secondary | ICD-10-CM | POA: Insufficient documentation

## 2019-03-10 DIAGNOSIS — Z20822 Contact with and (suspected) exposure to covid-19: Secondary | ICD-10-CM | POA: Insufficient documentation

## 2019-03-10 LAB — CBC
HCT: 42.7 % (ref 39.0–52.0)
Hemoglobin: 13.7 g/dL (ref 13.0–17.0)
MCH: 28.3 pg (ref 26.0–34.0)
MCHC: 32.1 g/dL (ref 30.0–36.0)
MCV: 88.2 fL (ref 80.0–100.0)
Platelets: 209 10*3/uL (ref 150–400)
RBC: 4.84 MIL/uL (ref 4.22–5.81)
RDW: 14.4 % (ref 11.5–15.5)
WBC: 8.2 10*3/uL (ref 4.0–10.5)
nRBC: 0 % (ref 0.0–0.2)

## 2019-03-10 LAB — COMPREHENSIVE METABOLIC PANEL
ALT: 16 U/L (ref 0–44)
AST: 18 U/L (ref 15–41)
Albumin: 3.5 g/dL (ref 3.5–5.0)
Alkaline Phosphatase: 73 U/L (ref 38–126)
Anion gap: 10 (ref 5–15)
BUN: 8 mg/dL (ref 6–20)
CO2: 23 mmol/L (ref 22–32)
Calcium: 9.3 mg/dL (ref 8.9–10.3)
Chloride: 106 mmol/L (ref 98–111)
Creatinine, Ser: 1.12 mg/dL (ref 0.61–1.24)
GFR calc Af Amer: >60 mL/min (ref >60)
GFR calc non Af Amer: >60 mL/min (ref >60)
Glucose, Bld: 115 mg/dL — ABNORMAL HIGH (ref 70–99)
Potassium: 4.1 mmol/L (ref 3.5–5.1)
Sodium: 139 mmol/L (ref 135–145)
Total Bilirubin: 1.6 mg/dL — ABNORMAL HIGH (ref 0.3–1.2)
Total Protein: 7.1 g/dL (ref 6.5–8.1)

## 2019-03-10 LAB — SARS CORONAVIRUS 2 (TAT 6-24 HRS): SARS Coronavirus 2: NEGATIVE

## 2019-03-10 LAB — POC SARS CORONAVIRUS 2 AG -  ED: SARS Coronavirus 2 Ag: NEGATIVE

## 2019-03-10 MED ORDER — CEFTRIAXONE SODIUM 500 MG IJ SOLR
1000.0000 mg | Freq: Once | INTRAMUSCULAR | Status: AC
  Administered 2019-03-10: 1000 mg via INTRAMUSCULAR
  Filled 2019-03-10: qty 1000

## 2019-03-10 MED ORDER — AMOXICILLIN 500 MG PO CAPS: 1000.0000 mg | ORAL_CAPSULE | Freq: Three times a day (TID) | ORAL | 0 refills | Status: DC

## 2019-03-10 MED ORDER — AZITHROMYCIN 250 MG PO TABS: ORAL_TABLET | ORAL | 0 refills | Status: AC

## 2019-03-10 MED ORDER — LIDOCAINE HCL (PF) 1 % IJ SOLN
INTRAMUSCULAR | Status: AC
  Filled 2019-03-10: qty 5

## 2019-03-10 NOTE — Discharge Instructions (Addendum)
You rapid covid was negative.  A conformation has been sent.  See your Physician for recheck on Monday.  Return if shortness of breath

## 2019-03-10 NOTE — ED Triage Notes (Signed)
Pt here from home with a cough and body aches and itchy watery eyes , pt thinks it is just allergies but wants to make sure

## 2019-03-10 NOTE — ED Provider Notes (Signed)
[facility] EMERGENCY DEPARTMENT Provider Note   CSN: [id number] Arrival date & time: 03/10/19  0254     History No chief complaint on file.   Jerry Stein is a 59 y.o. male.  The history is provided by the patient. No language interpreter was used.  Cough Cough characteristics:  Productive Sputum characteristics:  Nondescript Severity:  Moderate Onset quality:  Gradual Timing:  Constant Progression:  Worsening Chronicity:  New Smoker: no   Relieved by:  Nothing Worsened by:  Nothing Ineffective treatments:  None tried Associated symptoms: fever and shortness of breath    Pt complains of a cough and congestion.  Pt reports minimal shortness of breath    Past Medical History:  Diagnosis Date  . Anxiety   . Depression   . High blood pressure 05/25/2017  . Hyperlipidemia 06/09/2017  . Substance abuse ([place]) 1979   once     Patient Active Problem List   Diagnosis Date Noted  . Hyperlipidemia 06/09/2017  . Primary hypertension 05/25/2017  . Tobacco use disorder 06/29/2012  . Depression 06/29/2012  . Generalized anxiety disorder 06/29/2012    Past Surgical History:  Procedure Laterality Date  . COLONOSCOPY W/ POLYPECTOMY  2012   5 benign polyps   . EYE SURGERY Left 1976   blood clot in retina, removed, 1 month of vision loss  . KNEE CARTILAGE SURGERY  1998       Family History  Problem Relation Age of Onset  . Kidney disease Mother        died of renal failure   . Heart disease Mother        had pacemaker, took NTG.   . Cancer Sister 36       breast cancer   . Alcohol abuse Brother   . Cirrhosis Brother        related to alcoholism  . Cancer Maternal Grandmother        uterine cancer   . Diabetes Brother   . Hypertension Neg Hx     Social History   Tobacco Use  . Smoking status: Former Smoker    Packs/day: 0.30    Types: Cigarettes    Quit date: 01/05/2014    Years since quitting: 5.1  . Smokeless tobacco: Never Used  .  Tobacco comment: trying to quit  Substance Use Topics  . Alcohol use: Yes    Alcohol/week: 0.0 standard drinks    Comment: 4-5 drinks a month   . Drug use: No    Home Medications Prior to Admission medications   Medication Sig Start Date End Date Taking? Authorizing Provider  acetaminophen (TYLENOL 8 HOUR) 650 MG CR tablet Take 1 tablet (650 mg total) by mouth every 8 (eight) hours as needed for pain. 07/11/12   Funches, Adriana Mccallum, MD  amoxicillin (AMOXIL) 500 MG capsule Take 2 capsules (1,000 mg total) by mouth 3 (three) times daily. 03/10/19   Fransico Meadow, PA-C  atorvastatin (LIPITOR) 40 MG tablet Take 1 tablet (40 mg total) by mouth daily. 06/23/17   Chambliss, Jeb Levering, MD  azithromycin (ZITHROMAX Z-PAK) 250 MG tablet Take 2 tablets (500 mg) on  Day 1,  followed by 1 tablet (250 mg) once daily on Days 2 through 5. 03/10/19 03/15/19  Fransico Meadow, PA-C  buPROPion [facility] SR) 150 MG 12 hr tablet Take 1 tablet (150 mg total) by mouth 2 (two) times daily. 01/02/19   Mullis, Kiersten P, DO  cetirizine (ZYRTEC) 10 MG tablet Take  1 tablet (10 mg total) by mouth daily. 07/15/18   Lennox Solders, MD  DHEA 50 MG CAPS Take 1 capsule by mouth daily.    [provider]  DULoxetine (CYMBALTA) 60 MG capsule Take 1 capsule (60 mg total) by mouth daily. 09/02/17   Wendee Beavers, DO  fluticasone (FLONASE) 50 MCG/ACT nasal spray Place 2 sprays into both nostrils at bedtime. 03/06/16   McKeag, Janine Ores, MD  losartan (COZAAR) 50 MG tablet Take 1 tablet (50 mg total) by mouth daily. 07/15/18   Lennox Solders, MD    Allergies    Lisinopril and Shrimp [shellfish allergy]  Review of Systems   Review of Systems  Constitutional: Positive for fever.  Respiratory: Positive for cough and shortness of breath.   All other systems reviewed and are negative.   Physical Exam Updated Vital Signs BP (!) 164/112 (BP Location: Left Arm)   Pulse 86   Temp 98 F (36.7 C) (Oral)   Resp (!) 22   Ht 6'  6" (1.981 m)   Wt 122.5 kg   SpO2 96%   BMI 31.20 kg/m   Physical Exam Vitals and nursing note reviewed.  Constitutional:      Appearance: He is well-developed.  HENT:     Head: Normocephalic and atraumatic.     Right Ear: Tympanic membrane normal.     Left Ear: Tympanic membrane normal.  Eyes:     Conjunctiva/sclera: Conjunctivae normal.  Cardiovascular:     Rate and Rhythm: Normal rate and regular rhythm.     Heart sounds: No murmur.  Pulmonary:     Effort: Pulmonary effort is normal. No respiratory distress.     Breath sounds: Normal breath sounds.  Abdominal:     Palpations: Abdomen is soft.     Tenderness: There is no abdominal tenderness.  Musculoskeletal:        General: Normal range of motion.     Cervical back: Neck supple.  Skin:    General: Skin is warm and dry.  Neurological:     Mental Status: He is alert.  Psychiatric:        Mood and Affect: Mood normal.     ED Results / Procedures / Treatments   Labs (all labs ordered are listed, but only abnormal results are displayed) Labs Reviewed  COMPREHENSIVE METABOLIC PANEL - Abnormal; Notable for the following components:      Result Value   Glucose, Bld 115 (*)    Total Bilirubin 1.6 (*)    All other components within normal limits  SARS CORONAVIRUS 2 (TAT 6-24 HRS)  CBC  POC SARS CORONAVIRUS 2 AG -  ED    EKG None  Radiology DG Chest 2 View  Result Date: 03/10/2019 CLINICAL DATA:  Shortness of breath, suspicion for COVID. EXAM: CHEST - 2 VIEW COMPARISON:  None FINDINGS: Cardiomediastinal contours are normal. Hilar structures partially obscured by patchy airspace opacities. Airspace opacities favor the mid and lower chest and are greatest in the left mid chest, likely in the superior segment of the left lower lobe. No signs of pleural effusion. Visualized skeletal structures are unremarkable. IMPRESSION: Patchy bilateral airspace opacities, more confluent in the left mid chest, suspicious for multifocal  pneumonia. No signs of pleural effusion. Electronically Signed   By: Donzetta Kohut M.D.   On: 03/10/2019 09:29    Procedures Procedures (including critical care time)  Medications Ordered in ED Medications  lidocaine (PF) (XYLOCAINE) 1 % injection (has no  administration in time range)  cefTRIAXone (ROCEPHIN) injection 1,000 mg (1,000 mg Intramuscular Given 03/10/19 1217)    ED Course  I have reviewed the triage vital signs and the nursing notes.  Pertinent labs & imaging results that were available during my care of the patient were reviewed by me and considered in my medical decision making (see chart for details).    MDM Rules/Calculators/A&P                      MDM: chest xray shows multifocal pneumonia.  Rapid covid is negative.  Pt given rocephin 1 gram IM.  Rx for amox and zithromax.  Pt advised to recheck with his Md on Monday.  Pt advised to return here if increased shortness of breath.  Pt advised Covid test is pending Final Clinical Impression(s) / ED Diagnoses Final diagnoses:  Community acquired pneumonia, unspecified laterality    Rx / DC Orders ED Discharge Orders         Ordered    azithromycin (ZITHROMAX Z-PAK) 250 MG tablet     03/10/19 1148    amoxicillin (AMOXIL) 500 MG capsule  3 times daily     03/10/19 1148        An After Visit Summary was printed and given to the patient.    Elson Areas, [place] 03/10/19 1333    Little, Ambrose Finland, MD 03/10/19 1339

## 2019-03-16 ENCOUNTER — Encounter: Payer: Self-pay | Admitting: Family Medicine

## 2019-03-16 ENCOUNTER — Ambulatory Visit (INDEPENDENT_AMBULATORY_CARE_PROVIDER_SITE_OTHER): Payer: Self-pay | Admitting: Family Medicine

## 2019-03-16 ENCOUNTER — Other Ambulatory Visit: Payer: Self-pay

## 2019-03-16 VITALS — BP 124/70 | HR 89 | Ht 78.0 in | Wt 282.0 lb

## 2019-03-16 DIAGNOSIS — J189 Pneumonia, unspecified organism: Secondary | ICD-10-CM

## 2019-03-16 DIAGNOSIS — F329 Major depressive disorder, single episode, unspecified: Secondary | ICD-10-CM

## 2019-03-16 DIAGNOSIS — F32A Depression, unspecified: Secondary | ICD-10-CM

## 2019-03-16 DIAGNOSIS — F17200 Nicotine dependence, unspecified, uncomplicated: Secondary | ICD-10-CM

## 2019-03-16 DIAGNOSIS — Z1211 Encounter for screening for malignant neoplasm of colon: Secondary | ICD-10-CM

## 2019-03-16 MED ORDER — DULOXETINE HCL 30 MG PO CPEP: 30.0000 mg | ORAL_CAPSULE | Freq: Every day | ORAL | 3 refills | Status: DC

## 2019-03-16 NOTE — Progress Notes (Signed)
   Subjective:   Patient ID: Jerry Stein    DOB: 08/14/1960, 59 y.o. male   MRN: [id number]  Jerry Stein is a 59 y.o. male with a history of primary hypertension, depression, anxiety, hyperlipidemia, tobacco use disorder here for emergency room follow-up for pneumonia  Multifocal pneumonia  hospital follow-up Patient here today for hospital follow-up after being evaluated in the ED on 03/10/2019 for productive cough, watery eyes, headache, and shortness of breath.  Was found to have multifocal pneumonia.  Covid negative.  He was treated with 1 g Rocephin IM x1+ 10-day course of amoxicillin and 5-day course of azithromycin.  Endorses completion of the Azithromycin, few more days of the Amoxicillin. He notes improvement in his symptoms.  Denies any fevers, chills, cough, congestion, shortness of breath.  He does have some residual productive cough but overall much improved.   Review of Systems:  Per HPI.   PMFSH, medications and smoking status reviewed.  Objective:   BP 124/70   Pulse 89   Ht 6\' 6"  (1.981 m)   Wt 282 lb (127.9 kg)   SpO2 98%   BMI 32.59 kg/m  Vitals and nursing note reviewed.  General: pleasant older AA male, well nourished, well developed, in no acute distress with non-toxic appearance CV: regular rate and rhythm without murmurs, rubs, or gallops Lungs: clear to auscultation bilaterally with normal work of breathing on room air, no crackles, rhonchi appreciated Abdomen: soft, non-tender, non-distended, normoactive bowel sounds Skin: warm, dry Extremities: warm and well perfused MSK:  gait normal Neuro: Alert and oriented, speech normal  Assessment & Plan:   Tobacco use disorder Quit smoking 4 months ago. Currently on Wellbutrin 150mg  BID which he notes did help. Congratulated patient on his success. History of smoking 1/3 a pack x > 10 years. Will determine clearer time frame of smoking history at next visit to determine need for lung cancer screen.       Hospital Follow up for Multifocal Pneumonia: Repeat imaging not indicated at this time given improvement. Patient to finish antibiotics and monitor symptoms. RTC if worsening. Consider repeat chest x-ray if fails to improve given history of smoking and recent pneumonia.   Chronic Pain  Depression/Anxiety Was transitioned from Amitriptyline to Cymbalta 60mg  however he notes the Cymbalta gave him diarrhea and nausea. Stopped taking 1 year ago. Currently still taking Wellbutrin 150mg  BID. Patient would like to try lower dose of Cymbalta.  - Start Cymbalta 30mg  QD  - Continue Wellbutrin 150mg  BID - follow up if unable to tolerate new Cymbalta dose or as needed  Health Maintenance: FOBT ordered and patient instructed to bring back at earliest convenience. He voiced understanding and agreement with plan.   Orders Placed This Encounter  Procedures  . Fecal occult blood, imunochemical   Meds ordered this encounter  Medications  . DULoxetine (CYMBALTA) 30 MG capsule    Sig: Take 1 capsule (30 mg total) by mouth daily.    Dispense:  30 capsule    Refill:  3    , DO PGY-2, [facility] 03/19/2019 12:09 PM

## 2019-03-16 NOTE — Assessment & Plan Note (Addendum)
Quit smoking 4 months ago. Currently on Wellbutrin 150mg  BID which he notes did help. Congratulated patient on his success. History of smoking 1/3 a pack x > 10 years. Will determine clearer time frame of smoking history at next visit to determine need for lung cancer screen.

## 2019-03-16 NOTE — Patient Instructions (Addendum)
Thank you for coming to see me today. It was a pleasure to see you.   I am so glad you are feeling better. Please continue your antibiotics to completion. Please monitor your symptoms and return if you are getting worse.  Please start taking Cymbalta 30mg  QD and continue Wellbutrin.   Please return the stool card for your colon cancer screening.  Please follow-up with me as needed.   If you have any questions or concerns, please do not hesitate to call the office at [phone].  Take Care,   Dr. [phone], DO Resident Physician [facility] [phone]

## 2019-07-21 ENCOUNTER — Ambulatory Visit (INDEPENDENT_AMBULATORY_CARE_PROVIDER_SITE_OTHER): Payer: Self-pay | Admitting: Family Medicine

## 2019-07-21 ENCOUNTER — Other Ambulatory Visit: Payer: Self-pay

## 2019-07-21 DIAGNOSIS — I10 Essential (primary) hypertension: Secondary | ICD-10-CM

## 2019-07-21 DIAGNOSIS — G4733 Obstructive sleep apnea (adult) (pediatric): Secondary | ICD-10-CM

## 2019-07-21 DIAGNOSIS — E782 Mixed hyperlipidemia: Secondary | ICD-10-CM

## 2019-07-21 DIAGNOSIS — R5383 Other fatigue: Secondary | ICD-10-CM

## 2019-07-21 DIAGNOSIS — E669 Obesity, unspecified: Secondary | ICD-10-CM

## 2019-07-21 LAB — POCT GLYCOSYLATED HEMOGLOBIN (HGB A1C): Hemoglobin A1C: 5.7 % — AB (ref 4.0–5.6)

## 2019-07-21 NOTE — Progress Notes (Signed)
    SUBJECTIVE:   CHIEF COMPLAINT / HPI:   CC: weight gain, BP follow up   Jerry Stein is a 59 y.o. male here for concerns of weight gain and BP follow up.   Hypertension Patient here for follow-up of  arterial hypertension. Blood pressure is well controlled at home. He is not  exercising and is not adherent to a low-salt diet. He currently takes Losartan and is adherent to regimen.  Cardiac symptoms: fatigue. Patient denies: chest pain, chest pressure/discomfort, dyspnea, exertional chest pressure/discomfort, lower extremity edema, orthopnea, palpitations, paroxysmal nocturnal dyspnea and syncope. Would like to decrease and eventually stop taking antihypertensive medication.    HLD  Patient not adherent with Lipitor.  States he stopped this medication as his cholesterol was "good."   Weight gain  Patient reports weight gain in the past 6 months about ~40 lbs.  Patient eats on the food and does not read nutrition labels. Would like to start exercising as he "doesn't want to end up like his cousin."   Sleep Apnea  Reports morning fatigue.  Snores when he sleeps.  Has had a sleep study in the past but has never received CPAP as he doesn't have insurance.     PERTINENT  PMH / PSH: HTN, HLD   OBJECTIVE:   BP 120/60   Pulse 90   Wt (!) 304 lb 4 oz (138 kg)   SpO2 98%   BMI 35.16 kg/m   GEN: well developed male, in no acute distress  CV: regular rate and rhythm, no murmurs appreciated RESP: no increased work of breathing, clear to ascultation bilaterally  ABD: Bowel sounds present. Soft, Nontender, Nondistended. MSK: no lower extremity edema, normal ROM SKIN: warm, dry   ASSESSMENT/PLAN:   Hyperlipidemia Obtain Lipid panel.   - Refill Lipitor 40 mg sent to pharmacy   The 10-year ASCVD risk score Jerry Stein., et al., 2013) is: 12.3%   Values used to calculate the score:     Age: 64 years     Sex: Male     Is Non-Hispanic African American: Yes     Diabetic: No      Tobacco smoker: No     Systolic Blood Pressure: 120 mmHg     Is BP treated: Yes     HDL Cholesterol: 46 mg/dL     Total Cholesterol: 231 mg/dL   Primary hypertension Stable.  BP at goal. Continue current medications.  Medications: Losartan 50 mg  Cardiovascular risk factors: advanced age (older than 59 for men, 68 for women), diabetes mellitus, hypertension, male gender, obesity (BMI >= 30 kg/m2), sedentary lifestyle and pre-diabetes .  History of target organ damage: none.  Preventitive Healthcare:  Exercise: no   Diet Pattern: fried foods, eats on the go   Salt Restriction: None    Obesity (BMI 30-39.9) The patient is asked to make an attempt to improve diet and exercise patterns to aid in medical management of this problem.  - BMP  - Lipid panel  - A1c  - Healthy eating habits handout provided.    OSA (obstructive sleep apnea) Does not have a CPAP.  Asked patient to obtain insurance so that he can obtain CPAP.       Katha Cabal, DO [facility]

## 2019-07-21 NOTE — Patient Instructions (Signed)
It was great seeing you today!  Please check-out at the front desk before leaving the clinic. I'd like to see you back in 3 months but if you need to be seen earlier than that for any new issues we're happy to fit you in, just give [place] a call!  Visit Remembers: - Continue to work on your healthy eating habits and incorporating exercise into your daily life. - Your goal is to have an BP <120/ < 80 - To Do:  ** Look at the labels of the foods you eat  ** Do not drink high calorie drinks, Try to drink more water  ** Exercise  ** Shop the outside of the grocery store  ** Eat more vegetables!   Budget-Friendly Healthy Eating There are many ways to save money at the grocery store and continue to eat healthy. You can be successful if you:  Plan meals according to your budget.  Make a grocery list and only purchase food according to your grocery list.  Prepare food yourself. What are tips for following this plan?  Reading food labels  Compare food labels between brand name foods and the store brand. Often the nutritional value is the same, but the store brand is lower cost.  Look for products that do not have added sugar, fat, or salt (sodium). These often cost the same but are healthier for you. Products may be labeled as: ? Sugar-free. ? Nonfat. ? Low-fat. ? Sodium-free. ? Low-sodium.  Look for lean ground beef labeled as at least 92% lean and 8% fat. Shopping  Buy only the items on your grocery list and go only to the areas of the store that have the items on your list.  Use coupons only for foods and brands you normally buy. Avoid buying items you wouldn't normally buy simply because they are on sale.  Check online and in newspapers for weekly deals.  Buy healthy items from the bulk bins when available, such as herbs, spices, flour, pasta, nuts, and dried fruit.  Buy fruits and vegetables that are in season. Prices are usually lower on in-season produce.  Look at the unit  price on the price tag. Use it to compare different brands and sizes to find out which item is the best deal.  Choose healthy items that are often low-cost, such as carrots, potatoes, apples, bananas, and oranges. Dried or canned beans are a low-cost protein source.  Buy in bulk and freeze extra food. Items you can buy in bulk include meats, fish, poultry, frozen fruits, and frozen vegetables.  Avoid buying "ready-to-eat" foods, such as pre-cut fruits and vegetables and pre-made salads.  If possible, shop around to discover where you can find the best prices. Consider other retailers such as dollar stores, larger [employer], local fruit and vegetable stands, and farmers markets.  Do not shop when you are hungry. If you shop while hungry, it may be hard to stick to your list and budget.  Resist impulse buying. Use your grocery list as your official plan for the week.  Buy a variety of vegetables and fruits by purchasing fresh, frozen, and canned items.  Look at the top and bottom shelves for deals. Foods at eye level (eye level of an adult or child) are usually more expensive.  Be efficient with your time when shopping. The more time you spend at the store, the more money you are likely to spend.  To save money when choosing more expensive foods like meats and  dairy: ? Choose cheaper cuts of meat, such as bone-in chicken thighs and drumsticks instead of skinless and boneless chicken. When you are ready to prepare the chicken, you can remove the skin yourself to make it healthier. ? Choose lean meats like chicken or [place] instead of beef. ? Choose canned seafood, such as tuna, salmon, or sardines. ? Buy eggs as a low-cost source of protein. ? Buy dried beans and peas, such as lentils, split peas, or kidney beans instead of meats. Dried beans and peas are a good alternative source of protein. ? Buy the larger tubs of yogurt instead of individual-sized containers.  Choose water instead  of sodas and other sweetened beverages.  Avoid buying chips, cookies, and other "junk food." These items are usually expensive and not healthy. Cooking  Make extra food and freeze the extras in meal-sized containers or in individual portions for fast meals and snacks.  Pre-cook on days when you have extra time to prepare meals in advance. You can keep these meals in the fridge or freezer and reheat for a quick meal.  When you come home from the grocery store, wash, peel, and cut fruits and vegetables so they are ready to use and eat. This will help reduce food waste. Meal planning  Do not eat out or get fast food. Prepare food at home.  Make a grocery list and make sure to bring it with you to the store. If you have a smart phone, you could use your phone to create your shopping list.  Plan meals and snacks according to a grocery list and budget you create.  Use leftovers in your meal plan for the week.  Look for recipes where you can cook once and make enough food for two meals.  Include budget-friendly meals like stews, casseroles, and stir-fry dishes.  Try some meatless meals or try "no cook" meals like salads.  Make sure that half your plate is filled with fruits or vegetables. Choose from fresh, frozen, or canned fruits and vegetables. If eating canned, remember to rinse them before eating. This will remove any excess salt added for packaging. Summary  Eating healthy on a budget is possible if you plan your meals according to your budget, purchase according to your budget and grocery list, and prepare food yourself.  Tips for buying more food on a limited budget include buying generic brands, using coupons only for foods you normally buy, and buying healthy items from the bulk bins when available.  Tips for buying cheaper food to replace expensive food include choosing cheaper, lean cuts of meat, and buying dried beans and peas. This information is not intended to replace  advice given to you by your health care provider. Make sure you discuss any questions you have with your health care provider. Document Revised: 12/23/2016 Document Reviewed: 12/23/2016 Elsevier Patient Education  2020 [employer].    Regarding lab work today:  Due to recent changes in healthcare laws, you may see the results of your imaging and laboratory studies on MyChart before your provider has had a chance to review them.  I understand that in some cases there may be results that are confusing or concerning to you. Not all laboratory results come back in the same time frame and you may be waiting for multiple results in order to interpret others.  Please give [place] 72 hours in order for your provider to thoroughly review all the results before contacting the office for clarification of your results. If  everything is normal, you will get a letter in the mail or a message in My Chart. Please give [place] a call if you do not hear from [place] after 2 weeks.  Please bring all of your medications with you to each visit.    If you haven't already, sign up for My Chart to have easy access to your labs results, and communication with your primary care physician.  Feel free to call with any questions or concerns at any time, at [phone].   Take care,  Dr. Katherina [facility]

## 2019-07-22 LAB — LIPID PANEL
Chol/HDL Ratio: 5 ratio (ref 0.0–5.0)
Cholesterol, Total: 231 mg/dL — ABNORMAL HIGH (ref 100–199)
HDL: 46 mg/dL (ref >39)
LDL Chol Calc (NIH): 158 mg/dL — ABNORMAL HIGH (ref 0–99)
Triglycerides: 150 mg/dL — ABNORMAL HIGH (ref 0–149)
VLDL Cholesterol Cal: 27 mg/dL (ref 5–40)

## 2019-07-22 LAB — BASIC METABOLIC PANEL
BUN/Creatinine Ratio: 14 (ref 9–20)
BUN: 16 mg/dL (ref 6–24)
CO2: 23 mmol/L (ref 20–29)
Calcium: 9.3 mg/dL (ref 8.7–10.2)
Chloride: 107 mmol/L — ABNORMAL HIGH (ref 96–106)
Creatinine, Ser: 1.15 mg/dL (ref 0.76–1.27)
GFR calc Af Amer: 80 mL/min/{1.73_m2} (ref >59)
GFR calc non Af Amer: 69 mL/min/{1.73_m2} (ref >59)
Glucose: 99 mg/dL (ref 65–99)
Potassium: 3.9 mmol/L (ref 3.5–5.2)
Sodium: 143 mmol/L (ref 134–144)

## 2019-07-24 ENCOUNTER — Telehealth: Payer: Self-pay

## 2019-07-24 NOTE — Telephone Encounter (Signed)
Patient calls nurse line requesting lab results from visit on 07/21/2019. Unable to find result note or letter. I am happy to give patient detailed message of lab results.   To Dr. Rachael Darby and PCP  Veronda Prude, RN

## 2019-07-24 NOTE — Telephone Encounter (Signed)
I will defer this to Dr. Rachael Darby who ordered the labs.

## 2019-07-26 DIAGNOSIS — G4733 Obstructive sleep apnea (adult) (pediatric): Secondary | ICD-10-CM | POA: Insufficient documentation

## 2019-07-26 DIAGNOSIS — E669 Obesity, unspecified: Secondary | ICD-10-CM | POA: Insufficient documentation

## 2019-07-26 MED ORDER — LOSARTAN POTASSIUM 50 MG PO TABS: 50.0000 mg | ORAL_TABLET | Freq: Every day | ORAL | 3 refills | Status: DC

## 2019-07-26 MED ORDER — ATORVASTATIN CALCIUM 40 MG PO TABS: 40.0000 mg | ORAL_TABLET | Freq: Every day | ORAL | 3 refills | Status: DC

## 2019-07-26 NOTE — Assessment & Plan Note (Signed)
The patient is asked to make an attempt to improve diet and exercise patterns to aid in medical management of this problem.  - BMP  - Lipid panel  - A1c  - Healthy eating habits handout provided.

## 2019-07-26 NOTE — Telephone Encounter (Signed)
Late entry.   Discussed lab work with Jerry Stein.  BMP unremarkable. A1c indicated pre-diabetes. Reviewed significance of elevated LDL and cholesterol.  Ordered Lipitor refill.  Reviewed therapeutic lifestyle changes that were discussed during recent visit.

## 2019-07-26 NOTE — Assessment & Plan Note (Addendum)
Obtain Lipid panel.   - Refill Lipitor 40 mg sent to pharmacy   The 10-year ASCVD risk score [name] DC Montez Hageman., et al., 2013) is: 12.3%   Values used to calculate the score:     Age: 59 years     Sex: Male     Is Non-Hispanic African American: Yes     Diabetic: No     Tobacco smoker: No     Systolic Blood Pressure: 120 mmHg     Is BP treated: Yes     HDL Cholesterol: 46 mg/dL     Total Cholesterol: 231 mg/dL

## 2019-07-26 NOTE — Assessment & Plan Note (Signed)
Does not have a CPAP.  Asked patient to obtain insurance so that he can obtain CPAP.

## 2019-07-26 NOTE — Assessment & Plan Note (Signed)
Stable.  BP at goal. Continue current medications.  Medications: Losartan 50 mg  Cardiovascular risk factors: advanced age (older than 68 for men, 53 for women), diabetes mellitus, hypertension, male gender, obesity (BMI >= 30 kg/m2), sedentary lifestyle and pre-diabetes .  History of target organ damage: none.  Preventitive Healthcare:  Exercise: no   Diet Pattern: fried foods, eats on the go   Salt Restriction: None

## 2019-10-23 ENCOUNTER — Telehealth: Payer: Self-pay

## 2019-10-23 NOTE — Telephone Encounter (Signed)
Patient calls nurse line requesting to add HCTZ to current BP regimen. Patient reports that he has had weight gain over the last six months and intermittent shortness of breath with exertion. Patient denies [facility] currently. Advised patient that typically appointment is necessary in order to add new BP medications. Patient declines at this time and wants to wait to hear from provider.   To PCP  Veronda Prude, RN

## 2019-10-23 NOTE — Telephone Encounter (Signed)
Yes patient will need follow up appointment to be evaluated for SOB and before I will add on BP med. Thank you.

## 2019-11-06 NOTE — Telephone Encounter (Signed)
Called patient. No answer. Advised patient to return call to schedule follow up appointment.   Veronda Prude, RN

## 2019-11-21 DIAGNOSIS — R7303 Prediabetes: Secondary | ICD-10-CM | POA: Insufficient documentation

## 2019-11-21 NOTE — Progress Notes (Signed)
Subjective:   Patient ID: Jerry Stein    DOB: 26-Apr-1960, 59 y.o. male   MRN: [id number]  Jerry Stein is a 59 y.o. male with a history of HTN, OSA, depression, GAD, HLD, obesity here for blood pressure follow up.  HTN:  BP: (!) 154/82 today. Currently on Losartan 50mg  QD. Endorses compliance. Former smoker over past 2 weeks. Denies any chest pain, SOB, vision changes, or headaches. He complains of gaining weight. He works as a and does walk up stairs but does not do physical activity. Denies chest pain, palpitations. Gets winded after 2.5 flights of stairs. Denies orthopnea or PND.   HLD: Last lipid panel below. Currently on Atorvastatin 40mg  QD. HE was restarted on this medication in July 2021 after he self discontinued. Endorses compliance. Denies any muscles aches or weakness. The 10-year ASCVD risk score DC Jr., et al., 2013) is: 19%   Lab Results  Component Value Date   CHOL 231 (H) 07/21/2019   HDL 46 07/21/2019   LDLCALC 158 (H) 07/21/2019   TRIG 150 (H) 07/21/2019   CHOLHDL 5.0 07/21/2019   Health Maintenance: Health Maintenance Due  Topic  . COLONOSCOPY    Review of Systems:  Per HPI.   Objective:   BP (!) 154/82   Pulse 99   Wt (!) 308 lb 3.2 oz (139.8 kg)   SpO2 97%   BMI 35.62 kg/m  Vitals and nursing note reviewed.  General: pleasant older gentleman, sitting comfortably in exam chair, well nourished, well developed, in no acute distress with non-toxic appearance CV: regular rate and rhythm without murmurs, rubs, or gallops, trace lower extremity edema, 2+ radial and pedal pulses bilaterally Lungs: clear to auscultation bilaterally with normal work of breathing on room air, speaking in full sentences Skin: warm, dry Extremities: w normal tone MSK: , gait normal Neuro: Alert and oriented, speech normal  Assessment & Plan:   Primary hypertension Elevated on initial and repeat. Just took BP meds before visit. Does not monitor BP's at  home.  Nurse visit for repeat BP check scheduled. If remains elevated, will increase Losartan to 100mg  QD Follow up in Jan 2022  OSA (obstructive sleep apnea) Chronic, untreated due to lack of insurance. Possibly getting insurance in January. Will plan to discuss ordering CPAP titration study once insurance  Hyperlipidemia Chronic. Noncompliant with Atorvastatin. Instructed to restart. Will increase to high intensity statin. Follow up in Jan 2022  Shortness of breath At end of visit patient noted "feeling winded" when he exerts himself. Denies any chest pain, palpitation, orthopnea, LE swelling, PND, or infectious symptoms. Able to walk up 2.5 flights of stairs before feeling winded. He notes that he does have OSA but does not use CPAP due to lack of insurance. He has gained about 25lbs since last year and is not physically active other than work related activity. ON exam he is hemodynamically stable and satting 99% on room air. Lungs clear. Suspect likely deconditioning given lack of red flag symptoms. Recommended physical activity and weight loss. Follow up in January for further evaluation.  Health Maintenance: Colonoscopy ordered - patient to call to schedule at earliest convenience Declined Flu  Orders Placed This Encounter  Procedures  . Ambulatory referral to Gastroenterology    Referral Priority:   Routine    Referral Type:   Consultation    Referral Reason:   Specialty Services Required    Number of Visits Requested:   1   Meds ordered this  encounter  Medications  . atorvastatin (LIPITOR) 80 MG tablet    Sig: Take 1 tablet (80 mg total) by mouth daily.    Dispense:  90 tablet    Refill:  3    Orpah Cobb, DO PGY-3, [facility] 11/22/2019 9:29 AM

## 2019-11-22 ENCOUNTER — Ambulatory Visit (INDEPENDENT_AMBULATORY_CARE_PROVIDER_SITE_OTHER): Payer: Self-pay | Admitting: Family Medicine

## 2019-11-22 ENCOUNTER — Encounter: Payer: Self-pay | Admitting: Family Medicine

## 2019-11-22 ENCOUNTER — Other Ambulatory Visit: Payer: Self-pay

## 2019-11-22 VITALS — BP 154/82 | HR 99 | Wt 308.2 lb

## 2019-11-22 DIAGNOSIS — Z1211 Encounter for screening for malignant neoplasm of colon: Secondary | ICD-10-CM

## 2019-11-22 DIAGNOSIS — R0602 Shortness of breath: Secondary | ICD-10-CM

## 2019-11-22 DIAGNOSIS — R7303 Prediabetes: Secondary | ICD-10-CM

## 2019-11-22 DIAGNOSIS — E782 Mixed hyperlipidemia: Secondary | ICD-10-CM

## 2019-11-22 DIAGNOSIS — G4733 Obstructive sleep apnea (adult) (pediatric): Secondary | ICD-10-CM

## 2019-11-22 DIAGNOSIS — I10 Essential (primary) hypertension: Secondary | ICD-10-CM

## 2019-11-22 MED ORDER — ATORVASTATIN CALCIUM 80 MG PO TABS: 80.0000 mg | ORAL_TABLET | Freq: Every day | ORAL | 3 refills | Status: DC

## 2019-11-22 NOTE — Patient Instructions (Signed)
It was a pleasure to see you today!  Thank you for choosing [facility] for your primary care.  Jerry Stein was seen for blood pressure check.   Our plans for today were:  Take Atorvastatin 80mg  daily  Continue your Losartan 50mg  daily  Please return on 12/08/19 for blood pressure check at 3:30pm  recommend heart healthy/Mediterranean diet, with whole grains, fruits, vegetable, fish, lean meats, nuts, and olive oil. Limit salt. recommend moderate walking, 3-5 times/week for 30-50 minutes each session. Aim for at least 150 minutes.week. Goal should be pace of 3 miles/hours, or walking 1.5 miles in 30 minutes recommend avoidance of tobacco products. Avoid excess alcohol.  To keep you healthy, please keep in mind the following health maintenance items that you are due for:   1. Colonoscopy - please call to schedule at earliest convenience   You should return to our clinic in January 2022 for BP and SOB follow up.   Best Wishes,   14/3/21, DO

## 2019-11-22 NOTE — Assessment & Plan Note (Signed)
Chronic, untreated due to lack of insurance. Possibly getting insurance in January. Will plan to discuss ordering CPAP titration study once insurance

## 2019-11-22 NOTE — Assessment & Plan Note (Signed)
Elevated on initial and repeat. Just took BP meds before visit. Does not monitor BP's at home.  Nurse visit for repeat BP check scheduled. If remains elevated, will increase Losartan to 100mg  QD Follow up in Jan 2022

## 2019-11-22 NOTE — Assessment & Plan Note (Signed)
At end of visit patient noted "feeling winded" when he exerts himself. Denies any chest pain, palpitation, orthopnea, LE swelling, PND, or infectious symptoms. Able to walk up 2.5 flights of stairs before feeling winded. He notes that he does have OSA but does not use CPAP due to lack of insurance. He has gained about 25lbs since last year and is not physically active other than work related activity. ON exam he is hemodynamically stable and satting 99% on room air. Lungs clear. Suspect likely deconditioning given lack of red flag symptoms. Recommended physical activity and weight loss. Follow up in January for further evaluation.

## 2019-11-22 NOTE — Assessment & Plan Note (Signed)
Chronic. Noncompliant with Atorvastatin. Instructed to restart. Will increase to high intensity statin. Follow up in Jan 2022

## 2019-12-08 ENCOUNTER — Other Ambulatory Visit: Payer: Self-pay

## 2019-12-08 ENCOUNTER — Ambulatory Visit (INDEPENDENT_AMBULATORY_CARE_PROVIDER_SITE_OTHER): Payer: Self-pay | Admitting: *Deleted

## 2019-12-08 DIAGNOSIS — I10 Essential (primary) hypertension: Secondary | ICD-10-CM

## 2019-12-08 NOTE — Progress Notes (Signed)
Patient here today for BP check.      Per instructions we are to take his BP x 2 - 15 minutes apart  BP @ 3:36 pm is 140/98.   BP @ 3:55 pm is 138/90 Checked BP in left arm with large cuff.    Symptoms present: none.   Patient last took BP med 6am today.  Routed note to PCP for medication changes if any.  Pt has appt in late January for f/u.  He also asked if he could take his lipitor @ night, confirmed with Dr. Deirdre Priest and relayed to patient that he could.         Jone Baseman, CMA

## 2019-12-09 NOTE — Progress Notes (Signed)
Blood pressure improved. Will wait to make changes at this time and discuss at follow up visit in January.

## 2020-01-31 ENCOUNTER — Ambulatory Visit: Payer: Self-pay | Admitting: Family Medicine

## 2020-02-15 ENCOUNTER — Other Ambulatory Visit: Payer: Self-pay

## 2020-02-15 ENCOUNTER — Ambulatory Visit (AMBULATORY_SURGERY_CENTER): Payer: Self-pay

## 2020-02-15 VITALS — Ht 78.0 in | Wt 297.0 lb

## 2020-02-15 DIAGNOSIS — Z1211 Encounter for screening for malignant neoplasm of colon: Secondary | ICD-10-CM

## 2020-02-15 MED ORDER — SUTAB 1479-225-188 MG PO TABS: 12.0000 | ORAL_TABLET | ORAL | 0 refills | Status: DC

## 2020-02-15 NOTE — Progress Notes (Signed)
No allergies to soy or egg Pt is not on blood thinners or diet pills Denies issues with sedation/intubation Denies atrial flutter/fib Denies constipation   Pt is aware of Covid safety and care partner requirements.      

## 2020-02-26 ENCOUNTER — Telehealth: Payer: Self-pay | Admitting: Internal Medicine

## 2020-02-26 NOTE — Telephone Encounter (Signed)
Called patient to remind him of his appointment on 02-29-20 and he stated he needed to cancel his procedure due to insurance issues.  He will call back to reschedule appointment when he has insurance taken care of.

## 2020-02-29 ENCOUNTER — Encounter: Payer: Self-pay | Admitting: Internal Medicine

## 2020-07-13 ENCOUNTER — Other Ambulatory Visit: Payer: Self-pay | Admitting: Family Medicine

## 2020-07-22 ENCOUNTER — Encounter: Payer: Self-pay | Admitting: Family Medicine

## 2020-07-22 ENCOUNTER — Ambulatory Visit (INDEPENDENT_AMBULATORY_CARE_PROVIDER_SITE_OTHER): Payer: Self-pay | Admitting: Family Medicine

## 2020-07-22 ENCOUNTER — Other Ambulatory Visit: Payer: Self-pay

## 2020-07-22 DIAGNOSIS — I10 Essential (primary) hypertension: Secondary | ICD-10-CM

## 2020-07-22 DIAGNOSIS — F32A Depression, unspecified: Secondary | ICD-10-CM

## 2020-07-22 DIAGNOSIS — E785 Hyperlipidemia, unspecified: Secondary | ICD-10-CM

## 2020-07-22 DIAGNOSIS — R7303 Prediabetes: Secondary | ICD-10-CM

## 2020-07-22 MED ORDER — BUPROPION HCL ER (SR) 150 MG PO TB12: 150.0000 mg | ORAL_TABLET | Freq: Two times a day (BID) | ORAL | 3 refills | Status: DC

## 2020-07-22 MED ORDER — DULOXETINE HCL 30 MG PO CPEP: 30.0000 mg | ORAL_CAPSULE | Freq: Every day | ORAL | 3 refills | Status: DC

## 2020-07-22 MED ORDER — LOSARTAN POTASSIUM 50 MG PO TABS: 50.0000 mg | ORAL_TABLET | Freq: Every day | ORAL | 3 refills | Status: DC

## 2020-07-22 NOTE — Patient Instructions (Signed)
It was good to see you today.  Thank you for coming in.  I am refilling your Welbutrin, Citalopram, and Losartan.    I am also checking your electrolytes, blood sugar and cholesterol.  I will message you through MyChart with the results.  I would like to see you back again in 3 months.  Be Well, Dr Pecola Leisure

## 2020-07-23 LAB — LIPID PANEL
Chol/HDL Ratio: 5 ratio (ref 0.0–5.0)
Cholesterol, Total: 212 mg/dL — ABNORMAL HIGH (ref 100–199)
HDL: 42 mg/dL (ref >39)
LDL Chol Calc (NIH): 149 mg/dL — ABNORMAL HIGH (ref 0–99)
Triglycerides: 115 mg/dL (ref 0–149)
VLDL Cholesterol Cal: 21 mg/dL (ref 5–40)

## 2020-07-23 LAB — BASIC METABOLIC PANEL
BUN/Creatinine Ratio: 15 (ref 10–24)
BUN: 17 mg/dL (ref 8–27)
CO2: 23 mmol/L (ref 20–29)
Calcium: 8.9 mg/dL (ref 8.6–10.2)
Chloride: 106 mmol/L (ref 96–106)
Creatinine, Ser: 1.16 mg/dL (ref 0.76–1.27)
Glucose: 97 mg/dL (ref 65–99)
Potassium: 3.8 mmol/L (ref 3.5–5.2)
Sodium: 142 mmol/L (ref 134–144)
eGFR: 72 mL/min/{1.73_m2} (ref >59)

## 2020-07-23 LAB — HEMOGLOBIN A1C
Est. average glucose Bld gHb Est-mCnc: 128 mg/dL
Hgb A1c MFr Bld: 6.1 % — ABNORMAL HIGH (ref 4.8–5.6)

## 2020-07-23 NOTE — Assessment & Plan Note (Signed)
Well controlled today at 130/80.  Will plan on continuing Losartan 50 mg. - Refill Losartan  - follow-up in 3 weeks

## 2020-07-23 NOTE — Assessment & Plan Note (Addendum)
Repeat A1C is 6.1.  Cotinue to monitor going forward.  Discussed limiting sugary food such as poptarts in case of losing weight.  Also discussed continuing to limit fast foods and continuing to exercise. - Continue to monitor A1C - follow-up in 3 months

## 2020-07-23 NOTE — Progress Notes (Signed)
    SUBJECTIVE:   CHIEF COMPLAINT / HPI:   Patient here for follow-up.  Asking for refills on Cymbalta and Welbutrin.  Indicates he felt weight better controlled while on Welbutrin and did not feel making anxiety worse.  Felt was well controlled while on this and Cymbalta.  Indicates was taking both until around 2 weeks ago, but then realized prescriptions were out of date.   Patient also expresses concern regarding weight gain.  Indicates only has fast food 1 night a week but does frequently eat unhealthy foods like pop tarts.  Previously taking Atorvastatin for hyperlipidemia but felt was causing to gain weight.  Refusing to take going forward and asking if another medication to help better control cholesterol.  PERTINENT  PMH / PSH: HTN, HLD  OBJECTIVE:   BP 130/80   Pulse 87   Ht 6\' 6"  (1.981 m)   Wt (!) 305 lb 8 oz (138.6 kg)   SpO2 98%   BMI 35.30 kg/m    Physical Exam Constitutional:      Appearance: Normal appearance.  HENT:     Head: Normocephalic and atraumatic.  Cardiovascular:     Rate and Rhythm: Normal rate and regular rhythm.     Pulses: Normal pulses.  Pulmonary:     Effort: Pulmonary effort is normal.     Breath sounds: Normal breath sounds.  Skin:    General: Skin is warm.  Neurological:     Mental Status: He is alert.     ASSESSMENT/PLAN:   Prediabetes Repeat A1C is 6.1.  Cotinue to monitor going forward.  Discussed limiting sugary food such as poptarts in case of losing weight.  Also discussed continuing to limit fast foods and continuing to exercise. - Continue to monitor A1C - follow-up in 3 months  Depression - refilling and restarting Cymbalta and Welbutrin, has taken both medications together in past without issue  Hyperlipidemia Given hypertension and past LDL, important for patient to be on stating therapy. - Prescribe Rosuvastatin 20 mg  Primary hypertension Well controlled today at 130/80.  Will plan on continuing Losartan 50 mg. -  Refill Losartan  - follow-up in 3 weeks     , MD [facility]

## 2020-07-23 NOTE — Assessment & Plan Note (Signed)
Given hypertension and past LDL, important for patient to be on stating therapy. - Prescribe Rosuvastatin 20 mg

## 2020-07-23 NOTE — Assessment & Plan Note (Signed)
-   refilling and restarting Cymbalta and Welbutrin, has taken both medications together in past without issue

## 2021-01-08 ENCOUNTER — Telehealth: Payer: Self-pay | Admitting: *Deleted

## 2021-01-08 DIAGNOSIS — Z1211 Encounter for screening for malignant neoplasm of colon: Secondary | ICD-10-CM

## 2021-01-08 NOTE — Telephone Encounter (Signed)
Patient left a message on referral line asking to be referred for a colonoscopy.  Will forward to MD.  Burnard Hawthorne

## 2021-01-08 NOTE — Telephone Encounter (Signed)
Colonoscopy referral submitted  

## 2021-01-09 ENCOUNTER — Encounter: Payer: Self-pay | Admitting: *Deleted

## 2021-02-06 ENCOUNTER — Encounter: Payer: Self-pay | Admitting: Internal Medicine

## 2021-02-14 ENCOUNTER — Other Ambulatory Visit: Payer: Self-pay

## 2021-02-14 ENCOUNTER — Ambulatory Visit (INDEPENDENT_AMBULATORY_CARE_PROVIDER_SITE_OTHER): Payer: BC Managed Care – PPO | Admitting: Student

## 2021-02-14 ENCOUNTER — Encounter: Payer: Self-pay | Admitting: Student

## 2021-02-14 VITALS — BP 131/86 | HR 86 | Wt 315.0 lb

## 2021-02-14 DIAGNOSIS — R7303 Prediabetes: Secondary | ICD-10-CM

## 2021-02-14 DIAGNOSIS — I10 Essential (primary) hypertension: Secondary | ICD-10-CM | POA: Diagnosis not present

## 2021-02-14 DIAGNOSIS — R4589 Other symptoms and signs involving emotional state: Secondary | ICD-10-CM | POA: Diagnosis not present

## 2021-02-14 DIAGNOSIS — R5383 Other fatigue: Secondary | ICD-10-CM | POA: Diagnosis not present

## 2021-02-14 DIAGNOSIS — E785 Hyperlipidemia, unspecified: Secondary | ICD-10-CM | POA: Diagnosis not present

## 2021-02-14 DIAGNOSIS — G4733 Obstructive sleep apnea (adult) (pediatric): Secondary | ICD-10-CM | POA: Diagnosis not present

## 2021-02-14 LAB — POCT GLYCOSYLATED HEMOGLOBIN (HGB A1C): HbA1c, POC (prediabetic range): 6 % (ref 5.7–6.4)

## 2021-02-14 MED ORDER — BUPROPION HCL ER (XL) 150 MG PO TB24: 150.0000 mg | ORAL_TABLET | Freq: Every day | ORAL | 1 refills | Status: DC

## 2021-02-14 MED ORDER — FLUTICASONE PROPIONATE 50 MCG/ACT NA SUSP: 2.0000 | Freq: Every day | NASAL | 11 refills | Status: DC

## 2021-02-14 NOTE — Assessment & Plan Note (Addendum)
Sleep study ordered with split-night study given previously established diagnosis of OSA without CPAP use.  Patient confirmed that he does not have a CPAP.

## 2021-02-14 NOTE — Assessment & Plan Note (Addendum)
PHQ-9 score of 16 with negative question 9.  Scoring high for trouble falling asleep and energy in addition to eating but also difficulty with concentrating and little interest in doing things that he enjoys.  Depressive symptoms likely correlating to weight gain in addition to poor sleep and excess fatigue which was voiced as main concern today.  Restarted Wellbutrin XL 150 mg daily given benefit of both antidepressant effect and activating potential.

## 2021-02-14 NOTE — Patient Instructions (Signed)
It was great to see you today! Thank you for choosing [facility] for your primary care. Jerry Stein was seen for excess fatigue.  Today we addressed: Fatigue: I recommend working on improve sleep habits by having a regular bedtime routine without electronics 30 to 45 minutes prior to bedtime.  This allows the brain to calm down and be ready to fall asleep.  Additionally, an exercise routine has shown to have benefits with improved sleep.  I am checking several labs today to investigate whether or not there are other causes of fatigue.  However, I do believe the main component is your obstructive sleep apnea and have placed a sleep study referral so that we may get you set up with a CPAP in the future. I am also checking other labs related to your electrolytes, cholesterol, prediabetes.   Orders Placed This Encounter  Procedures   CBC   Basic Metabolic Panel   Lipid Panel   TSH   HgB A1c   Nocturnal polysomnography (NPSG)    If able, split night study. Patient has several sleep studies in the past most recently 2015 with diagnosed OSA but never pursued CPAP use. Encouraged to pursue at this time.    Standing Status:   Future    Standing Expiration Date:   02/14/2022    Order Specific Question:   Where should this test be performed:    Answer:   [facility] ordered this encounter  Medications   buPROPion (WELLBUTRIN XL) 150 MG 24 hr tablet    Sig: Take 1 tablet (150 mg total) by mouth daily.    Dispense:  90 tablet    Refill:  1    We are checking some labs today. If they are abnormal, I will call you. If they are normal, I will send you a MyChart message (if it is active) or a letter in the mail. If you do not hear about your labs in the next 2 weeks, please call the office.  You should return to our clinic Return in about 4 weeks (around 03/14/2021) for annual physical and fatigue follow up..  I recommend that you always bring your medications to each  appointment as this makes it easy to ensure you are on the correct medications and helps [place] not miss refills when you need them.  Please arrive 15 minutes before your appointment to ensure smooth check in process.  We appreciate your efforts in making this happen.  Take care and seek immediate care sooner if you develop any concerns.   Thank you for allowing me to participate in your care, Wells Guiles, DO 02/14/2021, 3:12 PM PGY-1, [place]

## 2021-02-14 NOTE — Assessment & Plan Note (Signed)
Well-controlled at 131/86.  Continue losartan 50 mg daily.  BMP check today.

## 2021-02-14 NOTE — Progress Notes (Signed)
SUBJECTIVE:   CHIEF COMPLAINT / HPI:   Excess fatigue: Patient states he has become more fatigued than he recalls in the past.  Unable to state timeframe.  He has been leaving work overly tired and nodding off at the light.  He goes to [employer] and gets a B vitamin which helps with having extra energy.  Patient states he is having trouble sleeping at night.  He recalls having 3 sleep studies done in the past and being diagnosed with obstructive sleep apnea.  Most recent study in 2015 at [place].  He is not currently use or have a CPAP.  Does endorse snoring at night.  He goes to bed around 10 PM and wakes up at 3 AM, does not feel sleep is refreshing.  He does try to take naps sometimes for an hour at a time which frequently turn into 5 to 6 hours of sleep.  He notes that prior to bedtime, he may watch TV or be on his phone or do some light reading.  He has not really tried any medications to help him fall asleep but in the past years ago tried pain medications.  Endorses minimal alcohol use: a 5th of Darrel Reach total in the last month.  Denies beer or wine.  Additionally, he no longer takes Wellbutrin or Cymbalta.  States he stopped Wellbutrin years ago as he used it to help him quit smoking and is no longer smoking so it did not need Wellbutrin anymore.  PHQ-9 score of 16 with negative question 9  PERTINENT  PMH / PSH: OSA, HTN, HLD, prediabetes, depression/GAD  OBJECTIVE:  BP 131/86    Pulse 86    Wt (!) 315 lb (142.9 kg)    SpO2 100%    BMI 36.40 kg/m   General: NAD, pleasant, able to participate in exam Neck: no thyromegaly or thryoid nodules appreciated Cardiac: RRR, no murmurs auscultated. Respiratory: CTAB, normal effort, no wheezes, rales or rhonchi  ASSESSMENT/PLAN:  Primary hypertension Well-controlled at 131/86.  Continue losartan 50 mg daily.  BMP check today.  Prediabetes A1c check today. 10 pound weight gain since last visit.  OSA (obstructive sleep apnea) Sleep study  ordered with split-night study given previously established diagnosis of OSA without CPAP use.  Patient confirmed that he does not have a CPAP.   Hyperlipidemia Lipid panel ordered.  Is not actively taking statin medication.  Depressed mood PHQ-9 score of 16 with negative question 9.  Scoring high for trouble falling asleep and energy in addition to eating but also difficulty with concentrating and little interest in doing things that he enjoys.  Depressive symptoms likely correlating to weight gain in addition to poor sleep and excess fatigue which was voiced as main concern today.  Restarted Wellbutrin XL 150 mg daily given benefit of both antidepressant effect and activating potential.  Fatigue Excess fatigue for unknown time period.  Discussed sleep habits and exercise in addition to multifactorial approach.  Likely most affected by untreated OSA.  Further we will check CBC, TSH for anemia and thyroid disorder.  Sleep study placed given most recent in 2015 and does not currently have CPAP.  Advised patient that OSA is the most important portion of treatment currently.  Discussed pathophysiology and reason for CPAP use in the setting of OSA.  Patient seemed enlightened and encouraged, understood plan with interest in CPAP use.  Orders Placed This Encounter  Procedures   CBC   Basic Metabolic Panel   Lipid Panel  TSH   HgB A1c   Nocturnal polysomnography (NPSG)    If able, split night study. Patient has several sleep studies in the past most recently 2015 with diagnosed OSA but never pursued CPAP use. Encouraged to pursue at this time.    Standing Status:   Future    Standing Expiration Date:   02/14/2022    Order Specific Question:   Where should this test be performed:    Answer:   [facility] ordered this encounter  Medications   buPROPion (WELLBUTRIN XL) 150 MG 24 hr tablet    Sig: Take 1 tablet (150 mg total) by mouth daily.    Dispense:  90 tablet     Refill:  1   fluticasone (FLONASE) 50 MCG/ACT nasal spray    Sig: Place 2 sprays into both nostrils at bedtime.    Dispense:  16 g    Refill:  11   Return in about 4 weeks (around 03/14/2021) for annual physical and fatigue follow up. Wells Guiles, DO 02/14/2021, 7:20 PM PGY-1, [place]

## 2021-02-14 NOTE — Assessment & Plan Note (Signed)
A1c check today. 10 pound weight gain since last visit.

## 2021-02-14 NOTE — Assessment & Plan Note (Addendum)
Excess fatigue for unknown time period.  Discussed sleep habits and exercise in addition to multifactorial approach.  Likely most affected by untreated OSA.  Further we will check CBC, TSH for anemia and thyroid disorder.  Sleep study placed given most recent in 2015 and does not currently have CPAP.  Advised patient that OSA is the most important portion of treatment currently.  Discussed pathophysiology and reason for CPAP use in the setting of OSA.  Patient seemed enlightened and encouraged, understood plan with interest in CPAP use.

## 2021-02-14 NOTE — Assessment & Plan Note (Signed)
Lipid panel ordered.  Is not actively taking statin medication.

## 2021-02-15 LAB — TSH: TSH: 0.972 u[IU]/mL (ref 0.450–4.500)

## 2021-02-15 LAB — CBC
Hematocrit: 42.6 % (ref 37.5–51.0)
Hemoglobin: 14 g/dL (ref 13.0–17.7)
MCH: 28.1 pg (ref 26.6–33.0)
MCHC: 32.9 g/dL (ref 31.5–35.7)
MCV: 85 fL (ref 79–97)
Platelets: 193 10*3/uL (ref 150–450)
RBC: 4.99 x10E6/uL (ref 4.14–5.80)
RDW: 13.3 % (ref 11.6–15.4)
WBC: 4.9 10*3/uL (ref 3.4–10.8)

## 2021-02-15 LAB — BASIC METABOLIC PANEL
BUN/Creatinine Ratio: 17 (ref 10–24)
BUN: 22 mg/dL (ref 8–27)
CO2: 22 mmol/L (ref 20–29)
Calcium: 9.4 mg/dL (ref 8.6–10.2)
Chloride: 106 mmol/L (ref 96–106)
Creatinine, Ser: 1.31 mg/dL — ABNORMAL HIGH (ref 0.76–1.27)
Glucose: 72 mg/dL (ref 70–99)
Potassium: 4.8 mmol/L (ref 3.5–5.2)
Sodium: 145 mmol/L — ABNORMAL HIGH (ref 134–144)
eGFR: 62 mL/min/{1.73_m2} (ref >59)

## 2021-02-15 LAB — LIPID PANEL
Chol/HDL Ratio: 4 ratio (ref 0.0–5.0)
Cholesterol, Total: 209 mg/dL — ABNORMAL HIGH (ref 100–199)
HDL: 52 mg/dL (ref >39)
LDL Chol Calc (NIH): 129 mg/dL — ABNORMAL HIGH (ref 0–99)
Triglycerides: 159 mg/dL — ABNORMAL HIGH (ref 0–149)
VLDL Cholesterol Cal: 28 mg/dL (ref 5–40)

## 2021-02-19 ENCOUNTER — Telehealth: Payer: Self-pay | Admitting: Student

## 2021-02-19 ENCOUNTER — Telehealth: Payer: Self-pay

## 2021-02-19 MED ORDER — ROSUVASTATIN CALCIUM 20 MG PO TABS: 20.0000 mg | ORAL_TABLET | Freq: Every day | ORAL | 5 refills | Status: DC

## 2021-02-19 NOTE — Telephone Encounter (Signed)
Patient calls nurse line reporting he viewed cholesterol results on my chart.   Patient reports he would like to start a statin. Patient reports he has been on Lipitor in the past, however if appropriate would like to start Crestor.   Will forward to PCP for advisement.

## 2021-02-19 NOTE — Telephone Encounter (Signed)
Patient to discuss labs and initiating medications for hypercholesterolemia.  Patient amenable to starting Crestor.  Prescription sent for Crestor 20 mg daily.  Recheck lipid panel at visit on 3/17 in addition to kidney function. Advised patient to avoid NSAID medications if able and continue to hydrate appropriately given minimally elevated creatinine.  Additionally discussed that may need to increase cholesterol medication dosage depending on future labs.  Scheduled for follow-up appointment on 3/17 at 3:05pm.

## 2021-02-24 MED ORDER — ROSUVASTATIN CALCIUM 20 MG PO TABS: 20.0000 mg | ORAL_TABLET | Freq: Every day | ORAL | 5 refills | Status: DC

## 2021-02-24 NOTE — Addendum Note (Signed)
Addended by: Dorna Bloom on: 02/24/2021 04:13 PM   Modules accepted: Orders

## 2021-02-24 NOTE — Telephone Encounter (Signed)
Medication set to print again.   Rosuvastatin 20mg  #30 refills 5 called into .

## 2021-02-24 NOTE — Telephone Encounter (Signed)
Patient calls nurse line reporting difficulties picking up Crestor.   Medication was originally set to print.   I have resent this.

## 2021-03-20 ENCOUNTER — Ambulatory Visit (AMBULATORY_SURGERY_CENTER): Payer: BC Managed Care – PPO | Admitting: *Deleted

## 2021-03-20 ENCOUNTER — Other Ambulatory Visit: Payer: Self-pay

## 2021-03-20 VITALS — Ht 78.0 in | Wt 290.0 lb

## 2021-03-20 MED ORDER — NA SULFATE-K SULFATE-MG SULF 17.5-3.13-1.6 GM/177ML PO SOLN: 1.0000 | Freq: Once | ORAL | 0 refills | Status: AC

## 2021-03-20 NOTE — Progress Notes (Signed)
? ? ?  SUBJECTIVE:  ? ?Chief compliant/HPI: annual examination ? ?Jerry Stein is a 61 y.o. who presents today for an annual exam.  ? ?History tabs reviewed and updated.  ? ?Pt also presenting for follow up to discuss fatigue and hyperlipidemia. ? ?HLD: Started on Crestor 20mg . Pt is compliant on medication.  ? ?Fatigue: At list visit, discussed sleep study to obtain CPAP and additionally restarted Wellbutrin XL 150mg  daily. Pt notes he has been able to get up before his alarm clock. His energy level has been better. He has not been contacted about the sleep study yet.  ? ?OBJECTIVE:  ? ?BP (!) 148/97   Pulse 69   Ht 6\' 6"  (1.981 m)   Wt (!) 309 lb (140.2 kg)   SpO2 98%   BMI 35.71 kg/m?   ?Gen: awake, alert, NAD ?CV: RRR, no murmurs auscultated ?Pulm: CTAB, no increased work of breathing, no crackles/rhonchi/wheezing ?Abdomen: obese abdomen, soft, normoactive bowel sounds ? ?ASSESSMENT/PLAN:  ? ?Primary hypertension ?Elevated today at 148/97. Readdress at next visit. Consider increasing losartan or adding another medication. ? ?OSA (obstructive sleep apnea) ?Not been contacted for sleep study yet. Sleep study ordered again with assistance from CMA for proper order. ? ?Fatigue ?Improved with addition of Wellbutrin XL 150mg . Readdressed that most beneficial change would be incorporating CPAP. ? ?Hyperlipidemia ?Endorsed taking medication although he believes statin is causing his weight gain (5lbs since last visit). Discussed that is not likely the case and and he has had steady weight gain likely due to diet and lack of exercise (75 lbs in 10 years). Discussed that lipid control benefit outweighs miniscule weight addition in the setting of risk of stroke/MI. Ordered lipid panel and bmp and advised patient we may need to consider increasing the dosage and further addressed dietary changes. F/u in 6 weeks. ? ?Obesity (BMI 30-39.9) ?Attempting to make dietary changes. Not yet incorporated exercise. Follow up in 6  weeks for further discussion.  ?  ? ?Annual Examination  ?See AVS for age appropriate recommendations.  ?PHQ score 4, reviewed and discussed.  ?Blood pressure value is not at goal (<130/80), discussed.  ? ?Considered the following screening exams based upon USPSTF recommendations: ?Diabetes screening:  Performed on 02/14/21, A1c 6.0 ?Screening for elevated cholesterol:  Performed on 02/14/21 ?HIV testing:  non-reactive on 03/06/16 ?Hepatitis C:  negative on 03/06/16 ?Hepatitis B: discussed ?Syphilis if at high risk: discussed ?Reviewed risk factors for latent tuberculosis and not indicated ?Colorectal cancer screening:  scheduled on 04/03/21. ?Lung cancer screening: discussed-<20 pack years.  ? ?Return in about 6 weeks (around 05/02/2021) for Follow-up weight loss and high blood pressure. ? ? ?05/06/16, DO ?[facility]   ?

## 2021-03-20 NOTE — Progress Notes (Signed)

## 2021-03-21 ENCOUNTER — Encounter: Payer: Self-pay | Admitting: Student

## 2021-03-21 ENCOUNTER — Other Ambulatory Visit: Payer: Self-pay

## 2021-03-21 ENCOUNTER — Ambulatory Visit: Payer: BC Managed Care – PPO | Admitting: Student

## 2021-03-21 VITALS — BP 148/97 | HR 69 | Ht 78.0 in | Wt 309.0 lb

## 2021-03-21 DIAGNOSIS — Z0000 Encounter for general adult medical examination without abnormal findings: Secondary | ICD-10-CM

## 2021-03-21 DIAGNOSIS — E785 Hyperlipidemia, unspecified: Secondary | ICD-10-CM

## 2021-03-21 DIAGNOSIS — R5383 Other fatigue: Secondary | ICD-10-CM

## 2021-03-21 DIAGNOSIS — G4733 Obstructive sleep apnea (adult) (pediatric): Secondary | ICD-10-CM | POA: Diagnosis not present

## 2021-03-21 DIAGNOSIS — I10 Essential (primary) hypertension: Secondary | ICD-10-CM

## 2021-03-21 DIAGNOSIS — E669 Obesity, unspecified: Secondary | ICD-10-CM

## 2021-03-21 NOTE — Patient Instructions (Signed)
It was great to see you today! Thank you for choosing [facility] for your primary care. Jerry Stein was seen for physical, follow-up for hyperlipidemia and fatigue. ? ?Today we addressed: ?Hyperlipidemia: We are rechecking your cholesterol today and kidney function. ?I am resubmitting the sleep study referral.  Please continue to work on your diet and exercise plan.  Slow changes are always better. ?You are already scheduled for your colonoscopy and up-to-date for other screenings.  I have given you an advance directive booklet.  Please discuss this with your loved one as these are important topics but take your time. ?Your blood pressure was high this visit.  I would like to we discussed this at your next visit. ? ? ?Orders Placed This Encounter  ?Procedures  ? Lipid Panel  ? Basic Metabolic Panel  ? Split night study  ?  Standing Status:   Future  ?  Standing Expiration Date:   03/22/2022  ?  Order Specific Question:   Where should this test be performed:  ?  Answer:   [facility]  ? ?No orders of the defined types were placed in this encounter. ? ? ?We are checking some labs today. If they are abnormal, I will call you. If they are normal, I will send you a MyChart message (if it is active) or a letter in the mail. If you do not hear about your labs in the next 2 weeks, please call the office. ? ?You should return to our clinic Return in about 6 weeks (around 05/02/2021) for Follow-up weight loss and high blood pressure.. ? ?I recommend that you always bring your medications to each appointment as this makes it easy to ensure you are on the correct medications and helps [place] not miss refills when you need them. ? ?Please arrive 15 minutes before your appointment to ensure smooth check in process.  We appreciate your efforts in making this happen. ? ?Take care and seek immediate care sooner if you develop any concerns.  ? ?Thank you for allowing me to participate in your care, ?[name], DO ?03/21/2021, 3:58 PM ?PGY-1,  Family Medicine ? ? ?

## 2021-03-22 LAB — LIPID PANEL
Chol/HDL Ratio: 2.9 ratio (ref 0.0–5.0)
Cholesterol, Total: 159 mg/dL (ref 100–199)
HDL: 55 mg/dL (ref >39)
LDL Chol Calc (NIH): 86 mg/dL (ref 0–99)
Triglycerides: 101 mg/dL (ref 0–149)
VLDL Cholesterol Cal: 18 mg/dL (ref 5–40)

## 2021-03-22 LAB — BASIC METABOLIC PANEL
BUN/Creatinine Ratio: 16 (ref 10–24)
BUN: 20 mg/dL (ref 8–27)
CO2: 26 mmol/L (ref 20–29)
Calcium: 9.6 mg/dL (ref 8.6–10.2)
Chloride: 105 mmol/L (ref 96–106)
Creatinine, Ser: 1.28 mg/dL — ABNORMAL HIGH (ref 0.76–1.27)
Glucose: 91 mg/dL (ref 70–99)
Potassium: 5.3 mmol/L — ABNORMAL HIGH (ref 3.5–5.2)
Sodium: 142 mmol/L (ref 134–144)
eGFR: 64 mL/min/{1.73_m2} (ref >59)

## 2021-03-22 NOTE — Assessment & Plan Note (Signed)
Elevated today at 148/97. Readdress at next visit. Consider increasing losartan or adding another medication. ?

## 2021-03-22 NOTE — Assessment & Plan Note (Signed)
Not been contacted for sleep study yet. Sleep study ordered again with assistance from CMA for proper order. ?

## 2021-03-22 NOTE — Assessment & Plan Note (Signed)
Attempting to make dietary changes. Not yet incorporated exercise. Follow up in 6 weeks for further discussion.  ?

## 2021-03-22 NOTE — Assessment & Plan Note (Signed)
Endorsed taking medication although he believes statin is causing his weight gain (5lbs since last visit). Discussed that is not likely the case and and he has had steady weight gain likely due to diet and lack of exercise (75 lbs in 10 years). Discussed that lipid control benefit outweighs miniscule weight addition in the setting of risk of stroke/MI. Ordered lipid panel and bmp and advised patient we may need to consider increasing the dosage and further addressed dietary changes. F/u in 6 weeks. ?

## 2021-03-22 NOTE — Assessment & Plan Note (Signed)
Improved with addition of Wellbutrin XL 150mg . Readdressed that most beneficial change would be incorporating CPAP. ?

## 2021-03-28 ENCOUNTER — Ambulatory Visit: Payer: BC Managed Care – PPO | Admitting: Student

## 2021-04-02 ENCOUNTER — Encounter: Payer: Self-pay | Admitting: Internal Medicine

## 2021-04-03 ENCOUNTER — Encounter: Payer: Self-pay | Admitting: Internal Medicine

## 2021-04-03 ENCOUNTER — Ambulatory Visit (AMBULATORY_SURGERY_CENTER): Payer: BC Managed Care – PPO | Admitting: Internal Medicine

## 2021-04-03 VITALS — BP 160/83 | HR 72 | Temp 97.8°F | Resp 12 | Ht 78.0 in | Wt 290.0 lb

## 2021-04-03 DIAGNOSIS — Z1211 Encounter for screening for malignant neoplasm of colon: Secondary | ICD-10-CM | POA: Diagnosis not present

## 2021-04-03 MED ORDER — SODIUM CHLORIDE 0.9 % IV SOLN: 500.0000 mL | INTRAVENOUS | Status: DC

## 2021-04-03 NOTE — Progress Notes (Signed)
Sedate, gd SR, tolerated procedure well, VSS, report to RN 

## 2021-04-03 NOTE — Progress Notes (Signed)
HISTORY OF PRESENT ILLNESS: ? ?Jerry Stein is a 61 y.o. male who was sent here for direct screening colonoscopy.  Patient tells me that he had colonoscopy in [facility] 2010.  No details.  He tells me that he had 8 polyps.  No details.  He is not sure about follow-up recommendations.  No active complaints.  Now for colonoscopy.  He tolerated his prep well ? ?REVIEW OF SYSTEMS: ? ?All non-GI ROS negative.   ?Past Medical History:  ?Diagnosis Date  ? Allergy   ? HEYFEVER,POLLEN  ? Anxiety   ? Arthritis   ? Cataract   ? MILD,BILATERAL  ? Clotting disorder ([place])   ? CLOT RETINA,LEFT 47 YEARS AGO,DUE TO EYE INJURY  ? Depression   ? High blood pressure 05/25/2017  ? Hyperlipidemia 06/09/2017  ? Sleep apnea   ? no cpap  ? Substance abuse ([place]) 1979  ? once   ? ? ?Past Surgical History:  ?Procedure Laterality Date  ? COLONOSCOPY  2010  ? In [place]  ? COLONOSCOPY W/ POLYPECTOMY  2012  ? 5 benign polyps   ? EYE SURGERY Left 1976  ? blood clot in retina, removed, 1 month of vision loss  ? KNEE CARTILAGE SURGERY Left 1998  ? ? ?Social History ?Afton Knickerbocker  reports that he quit smoking about 3 years ago. His smoking use included cigarettes. He started smoking about 25 years ago. He smoked an average of .3 packs per day. He has been exposed to tobacco smoke. He has never used smokeless tobacco. He reports current alcohol use. He reports that he does not use drugs. ? ?family history includes Alcohol abuse in his brother; Cancer in his maternal grandmother; Cancer (age of onset: 20) in his sister; Cirrhosis in his brother; Diabetes in his brother; Heart disease in his mother; Kidney disease in his mother; Uterine cancer in his maternal grandmother. ? ?Allergies  ?Allergen Reactions  ? Lisinopril Cough  ? Shrimp [Shellfish Allergy] Swelling  ? ? ?  ? ?PHYSICAL EXAMINATION: ? ?Vital signs: BP (!) 153/89   Pulse 84   Temp 97.8 ?F (36.6 ?C)   Resp (!) 28   Ht 6\' 6"  (1.981 m)   Wt 290 lb (131.5 kg)   SpO2 96%    BMI 33.51 kg/m?  ?General: Well-developed, well-nourished, no acute distress ?HEENT: Sclerae are anicteric, conjunctiva pink. Oral mucosa intact ?Lungs: Clear ?Heart: Regular ?Abdomen: soft, obese, nontender, nondistended, no obvious ascites, no peritoneal signs, normal bowel sounds. No organomegaly. ?Extremities: No edema ?Psychiatric: alert and oriented x3. Cooperative  ? ? ? ?ASSESSMENT: ? ?1.  Reported history of colon polyps sent for screening colonoscopy.  Last colonoscopy greater than 10 years ago ? ? ?PLAN: ? ?Colonoscopy ? ? ? ? ?  ?

## 2021-04-03 NOTE — Op Note (Signed)
[facility] ?Patient Name: Jerry Stein ?Procedure Date: 04/03/2021 4:02 PM ?MRN: [id number] ?Endoscopist: Wilhemina Bonito. Marina Goodell , MD ?Age: 61 ?Referring MD:  ?Date of Birth: 1960/10/29 ?Gender: Male ?Account #: [id number] ?Procedure:                Colonoscopy ?Indications:              Screening colonoscopy. Average risk. Reports  ?                          previous exam in [facility] 2010. No  ?                          details ?Medicines:                Monitored Anesthesia Care ?Procedure:                Pre-Anesthesia Assessment: ?                          - Prior to the procedure, a History and Physical  ?                          was performed, and patient medications and  ?                          allergies were reviewed. The patient's tolerance of  ?                          previous anesthesia was also reviewed. The risks  ?                          and benefits of the procedure and the sedation  ?                          options and risks were discussed with the patient.  ?                          All questions were answered, and informed consent  ?                          was obtained. Prior Anticoagulants: The patient has  ?                          taken no previous anticoagulant or antiplatelet  ?                          agents. ASA Grade Assessment: II - A patient with  ?                          mild systemic disease. After reviewing the risks  ?                          and benefits, the patient was deemed in  ?  satisfactory condition to undergo the procedure. ?                          After obtaining informed consent, the colonoscope  ?                          was passed under direct vision. Throughout the  ?                          procedure, the patient's blood pressure, pulse, and  ?                          oxygen saturations were monitored continuously. The  ?                          Olympus CF-[id number]) Colonoscope was  ?                           introduced through the anus and advanced to the the  ?                          cecum, identified by appendiceal orifice and  ?                          ileocecal valve. The ileocecal valve, appendiceal  ?                          orifice, and rectum were photographed. The quality  ?                          of the bowel preparation was excellent. The  ?                          colonoscopy was performed without difficulty. The  ?                          patient tolerated the procedure well. The bowel  ?                          preparation used was SUPREP via split dose  ?                          instruction. ?Scope In: 4:25:11 PM ?Scope Out: 4:35:19 PM ?Scope Withdrawal Time: 0 hours 8 minutes 11 seconds  ?Total Procedure Duration: 0 hours 10 minutes 8 seconds  ?Findings:                 Multiple diverticula were found in the sigmoid  ?                          colon. ?                          The exam was otherwise without abnormality on  ?  direct and retroflexion views. ?Complications:            No immediate complications. Estimated blood loss:  ?                          None. ?Estimated Blood Loss:     Estimated blood loss: none. ?Impression:               - Diverticulosis in the sigmoid colon. ?                          - The examination was otherwise normal on direct  ?                          and retroflexion views. ?                          - No specimens collected. ?Recommendation:           - Repeat colonoscopy in 10 years for screening  ?                          purposes. ?                          - Patient has a contact number available for  ?                          emergencies. The signs and symptoms of potential  ?                          delayed complications were discussed with the  ?                          patient. Return to normal activities tomorrow.  ?                          Written discharge instructions were provided to the  ?                           patient. ?                          - Resume previous diet. ?                          - Continue present medications. ?Wilhemina Bonito. Marina Goodell, MD ?04/03/2021 4:45:41 PM ?This report has been signed electronically. ?

## 2021-04-03 NOTE — Patient Instructions (Signed)
Read all of the information your recovery room gave you.  DO not eat anything gassy today. ? ?YOU HAD AN ENDOSCOPIC PROCEDURE TODAY AT THE [facility]:   Refer to the procedure report that was given to you for any specific questions about what was found during the examination.  If the procedure report does not answer your questions, please call your gastroenterologist to clarify.  If you requested that your care partner not be given the details of your procedure findings, then the procedure report has been included in a sealed envelope for you to review at your convenience later. ? ?YOU SHOULD EXPECT: Some feelings of bloating in the abdomen. Passage of more gas than usual.  Walking can help get rid of the air that was put into your GI tract during the procedure and reduce the bloating. If you had a lower endoscopy (such as a colonoscopy or flexible sigmoidoscopy) you may notice spotting of blood in your stool or on the toilet paper. If you underwent a bowel prep for your procedure, you may not have a normal bowel movement for a few days. ? ?Please Note:  You might notice some irritation and congestion in your nose or some drainage.  This is from the oxygen used during your procedure.  There is no need for concern and it should clear up in a day or so. ? ?SYMPTOMS TO REPORT IMMEDIATELY: ? ?Following lower endoscopy (colonoscopy or flexible sigmoidoscopy): ? Excessive amounts of blood in the stool ? Significant tenderness or worsening of abdominal pains ? Swelling of the abdomen that is new, acute ? Fever of 100?F or higher ? ? ?For urgent or emergent issues, a gastroenterologist can be reached at any hour by calling [phone]. ?Do not use MyChart messaging for urgent concerns.  ? ? ?DIET:  We do recommend a small meal at first, but then you may proceed to your regular diet.  Drink plenty of fluids but you should avoid alcoholic beverages for 24 hours. ?Try to increase the fiber in your diet, and  drink plenty of water. ? ?ACTIVITY:  You should plan to take it easy for the rest of today and you should NOT DRIVE or use heavy machinery until tomorrow (because of the sedation medicines used during the test).   ? ?FOLLOW UP: ?Our staff will call the number listed on your records 48-72 hours following your procedure to check on you and address any questions or concerns that you may have regarding the information given to you following your procedure. If we do not reach you, we will leave a message.  We will attempt to reach you two times.  During this call, we will ask if you have developed any symptoms of COVID 19. If you develop any symptoms (ie: fever, flu-like symptoms, shortness of breath, cough etc.) before then, please call [phone].  If you test positive for Covid 19 in the 2 weeks post procedure, please call and report this information to [place].   ? ?If any biopsies were taken you will be contacted by phone or by letter within the next 1-3 weeks.  Please call us at [phone] if you have not heard about the biopsies in 3 weeks.  ? ? ?SIGNATURES/CONFIDENTIALITY: ?You and/or your care partner have signed paperwork which will be entered into your electronic medical record.  These signatures attest to the fact that that the information above on your After Visit Summary has been reviewed and is understood.  Full responsibility of  the confidentiality of this discharge information lies with you and/or your care-partner.  ?

## 2021-04-07 ENCOUNTER — Telehealth: Payer: Self-pay | Admitting: *Deleted

## 2021-04-07 NOTE — Telephone Encounter (Signed)
?  Follow up Call- ? ? ?  04/03/2021  ?  3:33 PM  ?Call back number  ?Post procedure Call Back phone  # [phone]  ?Permission to leave phone message Yes  ?  ? ?Patient questions: ? ?Do you have a fever, pain , or abdominal swelling? No. ?Pain Score  0 * ? ?Have you tolerated food without any problems? Yes.   ? ?Have you been able to return to your normal activities? Yes.   ? ?Do you have any questions about your discharge instructions: ?Diet   No. ?Medications  No. ?Follow up visit  No. ? ?Do you have questions or concerns about your Care? No. ? ?Actions: ?* If pain score is 4 or above: ?No action needed, pain <4. ? ? ?

## 2021-05-06 ENCOUNTER — Encounter (HOSPITAL_BASED_OUTPATIENT_CLINIC_OR_DEPARTMENT_OTHER): Payer: Self-pay | Admitting: Emergency Medicine

## 2021-05-06 ENCOUNTER — Other Ambulatory Visit: Payer: Self-pay

## 2021-05-06 ENCOUNTER — Emergency Department (HOSPITAL_BASED_OUTPATIENT_CLINIC_OR_DEPARTMENT_OTHER): Payer: BC Managed Care – PPO | Admitting: Radiology

## 2021-05-06 ENCOUNTER — Emergency Department (HOSPITAL_BASED_OUTPATIENT_CLINIC_OR_DEPARTMENT_OTHER)
Admission: EM | Admit: 2021-05-06 | Discharge: 2021-05-06 | Disposition: A | Discharge status: Home / Self Care | Payer: BC Managed Care – PPO | Attending: Emergency Medicine | Admitting: Emergency Medicine

## 2021-05-06 DIAGNOSIS — Y9389 Activity, other specified: Secondary | ICD-10-CM | POA: Diagnosis not present

## 2021-05-06 DIAGNOSIS — M25561 Pain in right knee: Secondary | ICD-10-CM

## 2021-05-06 DIAGNOSIS — X501XXA Overexertion from prolonged static or awkward postures, initial encounter: Secondary | ICD-10-CM | POA: Diagnosis not present

## 2021-05-06 MED ORDER — KETOROLAC TROMETHAMINE 15 MG/ML IJ SOLN
15.0000 mg | Freq: Once | INTRAMUSCULAR | Status: AC
  Administered 2021-05-06: 15 mg via INTRAMUSCULAR
  Filled 2021-05-06: qty 1

## 2021-05-06 MED ORDER — IBUPROFEN 400 MG PO TABS
600.0000 mg | ORAL_TABLET | Freq: Once | ORAL | Status: DC
  Filled 2021-05-06: qty 1

## 2021-05-06 NOTE — ED Notes (Signed)
Discharge paperwork given and understood. 

## 2021-05-06 NOTE — ED Triage Notes (Signed)
Pt arrives to ED with c/o right knee pain. This started yesterday when pt was stepping up a step on a ladder.  ?

## 2021-05-06 NOTE — Discharge Instructions (Addendum)
It was a pleasure taking care of you!  ? ?Your x-ray was negative for fracture or dislocation.  You may take over the counter 600 mg Ibuprofen every 6 hours or 500 mg Tylenol every 6 hours as needed for pain for no more than 7 days.  You will be given a knee immobilizer, wear during the day, you may remove it at night.  You will be given crutches to aid with walking. You may apply ice to affected area for up to 15 minutes at a time. Ensure to place a barrier between your skin and the ice.  Attached is information for the on-call orthopedist, call and set up a follow-up appointment regarding today's ED visit.  You may follow-up with your primary care provider as needed.  Return to the Emergency Department if you are experiencing increasing/worsening pain, swelling, color change, fever, or worsening symptoms. ?

## 2021-05-06 NOTE — ED Provider Notes (Signed)
?[facility] EMERGENCY DEPT ?Provider Note ? ? ?CSN: [id number] ?Arrival date & time: 05/06/21  0948 ? ?  ? ?History ? ?Chief Complaint  ?Patient presents with  ? Knee Pain  ? ? ?Jerry Stein is a 61 y.o. male who presents to the emergency department complaining of right knee pain onset yesterday.  Patient notes that he was installing a ceiling fan and when he went to step up he felt a pop to his knee.  Tried Advil at home for his symptoms.  Denies color change, wound, gait problem, fever, chills. ? ?The history is provided by the patient. No language interpreter was used.  ? ?  ? ?Home Medications ?Prior to Admission medications   ?Medication Sig Start Date End Date Taking? Authorizing Provider  ?buPROPion (WELLBUTRIN XL) 150 MG 24 hr tablet Take 1 tablet (150 mg total) by mouth daily. 02/14/21   [name], DO  ?cetirizine (ZYRTEC) 10 MG tablet Take 1 tablet (10 mg total) by mouth daily. 07/15/18   Lennox Solders, MD  ?fluticasone (FLONASE) 50 MCG/ACT nasal spray Place 2 sprays into both nostrils at bedtime. 02/14/21   [name], DO  ?losartan (COZAAR) 50 MG tablet Take 1 tablet (50 mg total) by mouth daily. 07/22/20   Maness, Loistine Chance, MD  ?rosuvastatin (CRESTOR) 20 MG tablet Take 1 tablet (20 mg total) by mouth daily. 02/24/21 02/24/22  [name], DO  ?   ? ?Allergies    ?Lisinopril and Shrimp [shellfish allergy]   ? ?Review of Systems   ?Review of Systems  ?Constitutional:  Negative for chills and fever.  ?Musculoskeletal:  Positive for arthralgias and joint swelling. Negative for gait problem.  ?Skin:  Negative for color change, rash and wound.  ?All other systems reviewed and are negative. ? ?Physical Exam ?Updated Vital Signs ?BP (!) 159/95 (BP Location: Right Arm)   Pulse 85   Temp 97.9 ?F (36.6 ?C)   Resp 14   Ht 6\' 5"  (1.956 m)   Wt 127 kg   SpO2 99%   BMI 33.20 kg/m?  ?Physical Exam ?Vitals and nursing note reviewed.  ?Constitutional:   ?   General: He is not in acute  distress. ?   Appearance: Normal appearance.  ?Eyes:  ?   General: No scleral icterus. ?   Extraocular Movements: Extraocular movements intact.  ?Cardiovascular:  ?   Rate and Rhythm: Normal rate.  ?Pulmonary:  ?   Effort: Pulmonary effort is normal. No respiratory distress.  ?Musculoskeletal:  ?   Cervical back: Neck supple.  ?   Comments: Mild tenderness to palpation noted to right medial aspect of the knee.  No overlying skin changes.  No increased warmth to the joint.  No appreciable swelling noted to right knee.  Able to flex and extend right knee against resistance without difficulty.  Patient able to ambulate from wheelchair to bed without assistance.  ?Skin: ?   General: Skin is warm and dry.  ?   Findings: No bruising, erythema or rash.  ?Neurological:  ?   Mental Status: He is alert.  ?Psychiatric:     ?   Behavior: Behavior normal.  ? ? ?ED Results / Procedures / Treatments   ?Labs ?(all labs ordered are listed, but only abnormal results are displayed) ?Labs Reviewed - No data to display ? ?EKG ?None ? ?Radiology ?DG Knee Complete 4 Views Right ? ?Result Date: 05/06/2021 ?CLINICAL DATA:  Anterior right knee pain after stepping up onto ladder yesterday EXAM: RIGHT  KNEE - COMPLETE 4+ VIEW COMPARISON:  None Available. FINDINGS: No fracture or dislocation. Minimal spurring of the medial compartment. Small knee joint effusion. Mild atherosclerotic changes seen throughout visualized arterial segments. IMPRESSION: Small knee joint effusion without underlying acute osseous abnormality. Electronically Signed   By: Acquanetta Belling M.D.   On: 05/06/2021 10:43   ? ?Procedures ?Procedures  ? ? ?Medications Ordered in ED ?Medications  ?ibuprofen (ADVIL) tablet 600 mg (has no administration in time range)  ? ? ?ED Course/ Medical Decision Making/ A&P ?Clinical Course as of 05/06/21 1105  ?Tue May 06, 2021  ?1047 Small knee joint effusion without underlying acute osseous abnormality. [SB]  ?1055 Updated patient [SB]  ?   ?Clinical Course User Index ?[SB] Christen Wardrop A, PA-C  ? ?                        ?Medical Decision Making ?Amount and/or Complexity of Data Reviewed ?Radiology: ordered. ? ? ?Patient with right knee pain onset yesterday.  Patient was stepping up to install a ceiling fan when he felt a pop to the right knee.  Has tried Advil at home.  Vital signs stable, patient afebrile. On exam, patient with Mild tenderness to palpation noted to left medial aspect of the knee.  No overlying skin changes.  No increased warmth to the joint.  Able to flex and extend left knee against resistance without difficulty.  Patient able to ambulate from wheelchair to bed without assistance.  Sensation and pulses intact bilaterally to lower extremities. Differential diagnosis includes strain, septic arthritis, fracture, dislocation, ligamentous injury.   ? ? ?Imaging: ?I ordered imaging studies including right knee x-ray ?I independently visualized and interpreted imaging which showed: Small right knee joint effusion without underlying acute osseous abnormality ?I agree with the radiologist interpretation ? ?Medications:  ?I ordered medication including ice and toradol for symptom management  ?Reevaluation of the patient after these medicines and interventions, I reevaluated the patient and found that they have improved ?I have reviewed the patients home medicines and have made adjustments as needed ? ? ?Disposition: ?Pt presentation suspicious for acute pain of right knee with effusion. Right knee xray notable for small joint effusion, otherwise negative for acute fractures or dislocations. Patient afebrile with vital signs within normal limits, this is less likely septic arthritis at this time. Less likely osteoarthritis due to no radiographic findings. After consideration of the diagnostic results and the patients response to treatment, I feel that the patient would benefit from Discharge home.  Knee immobilizer and crutches provided to  patient today.  Patient given information for on-call orthopedist to call and set up a follow-up appointment regarding today's ED visit.  Work note provided.  Strict return precautions provided to patient regarding fever, increasing/worsening knee swelling, color change, or gait issue. Supportive care measures and strict return precautions discussed with patient at bedside. Pt acknowledges and verbalizes understanding. Pt appears safe for discharge. Follow up as indicated in discharge paperwork.  ? ? ?This chart was dictated using voice recognition software, Dragon. Despite the best efforts of this provider to proofread and correct errors, errors may still occur which can change documentation meaning. ? ?Final Clinical Impression(s) / ED Diagnoses ?Final diagnoses:  ?Acute pain of right knee  ? ? ?Rx / DC Orders ?ED Discharge Orders   ? ? None  ? ?  ? ? ?  ?Calix Heinbaugh A, PA-C ?05/06/21 1138 ? ?  ?Long, Arlyss Repress, MD ?  05/08/21 1124 ? ?

## 2021-05-07 DIAGNOSIS — M25561 Pain in right knee: Secondary | ICD-10-CM | POA: Diagnosis not present

## 2021-05-15 DIAGNOSIS — M25561 Pain in right knee: Secondary | ICD-10-CM | POA: Diagnosis not present

## 2021-05-18 ENCOUNTER — Encounter (HOSPITAL_BASED_OUTPATIENT_CLINIC_OR_DEPARTMENT_OTHER): Payer: BC Managed Care – PPO | Admitting: Internal Medicine

## 2021-05-23 DIAGNOSIS — S83241A Other tear of medial meniscus, current injury, right knee, initial encounter: Secondary | ICD-10-CM | POA: Diagnosis not present

## 2021-06-04 ENCOUNTER — Ambulatory Visit (INDEPENDENT_AMBULATORY_CARE_PROVIDER_SITE_OTHER): Payer: BC Managed Care – PPO | Admitting: Student

## 2021-06-04 ENCOUNTER — Encounter: Payer: Self-pay | Admitting: Student

## 2021-06-04 VITALS — BP 131/86 | HR 82 | Ht 78.0 in | Wt 307.2 lb

## 2021-06-04 DIAGNOSIS — E875 Hyperkalemia: Secondary | ICD-10-CM | POA: Diagnosis not present

## 2021-06-04 DIAGNOSIS — R7303 Prediabetes: Secondary | ICD-10-CM

## 2021-06-04 DIAGNOSIS — Z01818 Encounter for other preprocedural examination: Secondary | ICD-10-CM

## 2021-06-04 DIAGNOSIS — I10 Essential (primary) hypertension: Secondary | ICD-10-CM

## 2021-06-04 DIAGNOSIS — G4733 Obstructive sleep apnea (adult) (pediatric): Secondary | ICD-10-CM

## 2021-06-04 LAB — POCT GLYCOSYLATED HEMOGLOBIN (HGB A1C): HbA1c, POC (prediabetic range): 5.9 % (ref 5.7–6.4)

## 2021-06-04 NOTE — Patient Instructions (Signed)
It was great to see you today! Thank you for choosing [facility] for your primary care. Jerry Stein was seen for preop surgical clearance.  Today we addressed: Preop clearance: I am clearing you for this low risk surgery under the condition that you complete your sleep study and start using CPAP prior.  I would highly recommend using physical therapy afterwards. We are checking some labs on you because your potassium was a little high last time. Your blood pressure was better on recheck but this will be something that we we will need to watch.   Orders Placed This Encounter  Procedures   Basic Metabolic Panel   POCT glycosylated hemoglobin (Hb A1C)    Associate with Z13.1   If you haven't already, sign up for My Chart to have easy access to your labs results, and communication with your primary care physician.  We are checking some labs today. If they are abnormal, I will call you. If they are normal, I will send you a MyChart message (if it is active) or a letter in the mail. If you do not hear about your labs in the next 2 weeks, please call the office.   You should return to our clinic No follow-ups on file.  I recommend that you always bring your medications to each appointment as this makes it easy to ensure you are on the correct medications and helps [place] not miss refills when you need them.  Please arrive 15 minutes before your appointment to ensure smooth check in process.  We appreciate your efforts in making this happen.  Please call the clinic at [phone] if your symptoms worsen or you have any concerns.  Thank you for allowing me to participate in your care, Jerry Mattocks, DO 06/04/2021, 11:21 AM PGY-1, [facility]

## 2021-06-04 NOTE — Progress Notes (Signed)
  SUBJECTIVE:  Jerry Stein  is here for a Pre-operative physical at the request of Dr. Duwayne Heck.   He  is having right knee arthroscopic partial medial meniscectomy surgery date yet TBD.  PMHx: prediabetes, OSA (not currently using CPAP), HTN, HLD  Personal or family hx of adverse outcome to anesthesia? No  Chipped, cracked, missing, or loose teeth? No  Decreased ROM of neck? No  Able to walk up 2 flights of stairs without becoming significantly short of breath or having chest pain? No   Revised Goldman Criteria: High Risk Surgery (intraperitoneal, intrathoracic, aortic): No  Ischemic heart disease (Prior MI, +excercise stress test, angina, nitrate use, Qwave): No  History of congestive heart failure: No  History of cerebrovascular disease (TIA or stroke): No  Diabetes requiring preoperative treatment with insulin: No  Preoperative Cr >2.0: No   Revised Cardiac Risk Index Scoring - risk for death, MI, or cardiac arrest No risk factors -- 0.4% One risk factor -- 0.9%  Two risk factors -- 6.6%  Three or more risk factors -- >11%   OBJECTIVE:   Vitals:   06/04/21 1047 06/04/21 1113  BP: (!) 142/87 131/86  Pulse: 82   SpO2: 98%   Weight: (!) 307 lb 4 oz (139.4 kg)   Height: 6\' 6"  (1.981 m)    Body mass index is 35.51 kg/m.  General:  well developed, well nourished, NAD Skin:  warm, no pallor or diaphoresis Throat/Pharynx:  lips and gingiva without lesion; tongue and uvula midline; non-inflamed pharynx; no exudates or postnasal drainage Neck: normal ROM Lungs:  clear to auscultation, breath sounds equal bilaterally, no respiratory distress Cardio:  regular rate and rhythm without murmurs Abdomen:  abdomen soft, nontender; bowel sounds normal; no masses, hepatomegaly or splenomegaly Musculoskeletal:  symmetrical muscle groups noted without atrophy or deformity Extremities:  no clubbing, cyanosis, or edema, no deformities, no skin discoloration Psych: Age appropriate judgment  and insight; normal mood  ASSESSMENT/PLAN:  Hyperkalemia Potassium of 5.3 when last checked.  Likely due to losartan.  Recheck BMP today.  Primary hypertension Elevated initially at 142/87 but 131/86 on recheck.  Continue losartan 50 mg daily.  OSA (obstructive sleep apnea) Has sleep medicine follow-up on 06/16/2021.  Has not yet been set up with sleep study and CPAP.  Preoperative clearance Discussed physical therapy versus partial meniscectomy with patient.  Patient prefers surgery at this time.  Highly advised physical therapy afterwards.  Patient is deemed low cardiac risk for the proposed procedure.  However is not medically cleared until completing evaluation for obstructive sleep apnea and being set up with CPAP.  Prediabetes Recheck A1c given prediabetic range and requiring surgical clearance for today's visit.  A1c was 5.9.  Still in prediabetic range.   Orders Placed This Encounter  Procedures   Basic Metabolic Panel   POCT glycosylated hemoglobin (Hb A1C)    Associate with Z13.1   Pending the above workup, the patient is deemed low cardiac risk for the proposed procedure.  The patient voiced understanding and agreement to the plan.  08/16/2021, DO 06/04/2021, 1:51 PM PGY-1, [facility]

## 2021-06-04 NOTE — Assessment & Plan Note (Addendum)
Discussed physical therapy versus partial meniscectomy with patient.  Patient prefers surgery at this time.  Highly advised physical therapy afterwards.  Patient is deemed low cardiac risk for the proposed procedure.  However is not medically cleared until completing evaluation for obstructive sleep apnea and being set up with CPAP.

## 2021-06-04 NOTE — Assessment & Plan Note (Signed)
Has sleep medicine follow-up on 06/16/2021.  Has not yet been set up with sleep study and CPAP.

## 2021-06-04 NOTE — Assessment & Plan Note (Signed)
Potassium of 5.3 when last checked.  Likely due to losartan.  Recheck BMP today.

## 2021-06-04 NOTE — Assessment & Plan Note (Signed)
Elevated initially at 142/87 but 131/86 on recheck.  Continue losartan 50 mg daily.

## 2021-06-04 NOTE — Assessment & Plan Note (Signed)
Recheck A1c given prediabetic range and requiring surgical clearance for today's visit.  A1c was 5.9.  Still in prediabetic range.

## 2021-06-05 LAB — BASIC METABOLIC PANEL
BUN/Creatinine Ratio: 18 (ref 10–24)
BUN: 21 mg/dL (ref 8–27)
CO2: 19 mmol/L — ABNORMAL LOW (ref 20–29)
Calcium: 9 mg/dL (ref 8.6–10.2)
Chloride: 107 mmol/L — ABNORMAL HIGH (ref 96–106)
Creatinine, Ser: 1.16 mg/dL (ref 0.76–1.27)
Glucose: 114 mg/dL — ABNORMAL HIGH (ref 70–99)
Potassium: 4.1 mmol/L (ref 3.5–5.2)
Sodium: 139 mmol/L (ref 134–144)
eGFR: 72 mL/min/{1.73_m2} (ref >59)

## 2021-06-06 ENCOUNTER — Telehealth: Payer: Self-pay | Admitting: Student

## 2021-06-06 NOTE — Telephone Encounter (Signed)
Patient dropped off disability form to be completed. Last DOS was 06/04/21. Placed in [employer].

## 2021-06-06 NOTE — Telephone Encounter (Signed)
Clinical info completed on work form.  Placed form in Dr. Dahbura's box for completion.    When form is completed, please route note to "RN Team" and place in wall pocket in front office.   Nikesha Kwasny, CMA  

## 2021-06-10 ENCOUNTER — Encounter: Payer: Self-pay | Admitting: *Deleted

## 2021-06-10 NOTE — Telephone Encounter (Signed)
Form placed up front for pick up.   Copy made for batch scanning.   Patient is aware.  

## 2021-06-16 ENCOUNTER — Ambulatory Visit (HOSPITAL_BASED_OUTPATIENT_CLINIC_OR_DEPARTMENT_OTHER): Payer: BC Managed Care – PPO | Attending: Family Medicine | Admitting: Internal Medicine

## 2021-06-16 DIAGNOSIS — G4736 Sleep related hypoventilation in conditions classified elsewhere: Secondary | ICD-10-CM | POA: Diagnosis not present

## 2021-06-16 DIAGNOSIS — R5383 Other fatigue: Secondary | ICD-10-CM | POA: Insufficient documentation

## 2021-06-16 DIAGNOSIS — G4733 Obstructive sleep apnea (adult) (pediatric): Secondary | ICD-10-CM | POA: Diagnosis not present

## 2021-06-21 DIAGNOSIS — R5383 Other fatigue: Secondary | ICD-10-CM | POA: Diagnosis not present

## 2021-06-21 NOTE — Procedures (Signed)
    Patient Name: Jerry Stein, Jerry Stein Date: 06/16/2021 Gender: Male D.O.B: [birth year] Age (years): 62 Referring Provider: Leighton Roach McDiarmid Height (inches): 78 Interpreting Physician: Jetty Duhamel MD, ABSM Weight (lbs): 285 RPSGT: Shelah Lewandowsky BMI: 33 MRN: [id number] Neck Size: 17.25  CLINICAL INFORMATION Sleep Study Type: NPSG Indication for sleep study: Fatigue, Hypertension, Obesity, Re-Evaluation, Snoring Epworth Sleepiness Score: 5  Most recent polysomnogram dated 06/26/2014 revealed an AHI of 45.8/h. SLEEP STUDY TECHNIQUE As per the AASM Manual for the Scoring of Sleep and Associated Events v2.3 (April 2016) with a hypopnea requiring 4% desaturations.  The channels recorded and monitored were frontal, central and occipital EEG, electrooculogram (EOG), submentalis EMG (chin), nasal and oral airflow, thoracic and abdominal wall motion, anterior tibialis EMG, snore microphone, electrocardiogram, and pulse oximetry.  MEDICATIONS Medications self-administered by patient taken the night of the study : TOPIRAMATE  SLEEP ARCHITECTURE The study was initiated at 10:53:57 PM and ended at 5:01:40 AM.  Sleep onset time was 95.5 minutes and the sleep efficiency was 58.2%%. The total sleep time was 214 minutes.  Stage REM latency was 163.5 minutes.  The patient spent 25.0%% of the night in stage N1 sleep, 62.4%% in stage N2 sleep, 0.0%% in stage N3 and 12.6% in REM.  Alpha intrusion was absent.  Supine sleep was 25.44%.  RESPIRATORY PARAMETERS The overall apnea/hypopnea index (AHI) was 75.1 per hour. There were 3 total apneas, including 3 obstructive, 0 central and 0 mixed apneas. There were 265 hypopneas and 11 RERAs.  The AHI during Stage REM sleep was 71.1 per hour.  AHI while supine was 72.7 per hour.  The mean oxygen saturation was 89.8%. The minimum SpO2 during sleep was 70.0%.  moderate snoring was noted during this study.  CARDIAC DATA The 2 lead EKG  demonstrated sinus rhythm. The mean heart rate was 65.4 beats per minute. Other EKG findings include: PVCs.  LEG MOVEMENT DATA The total PLMS were 0 with a resulting PLMS index of 0.0. Associated arousal with leg movement index was 0.0 .  IMPRESSIONS - Severe obstructive sleep apnea occurred during this study (AHI = 75.1/h). - Insufficient early sleep and events to meet protocol requirements for split protocol CPAP titration. - Severe oxygen desaturation was noted during this study (Min O2 = 70.0%). Mean 89.8%, indicating nocturnal hypoxemia. - The patient snored with moderate snoring volume. - EKG findings include PVCs. - Clinically significant periodic limb movements did not occur during sleep. No significant associated arousals.  DIAGNOSIS - Obstructive Sleep Apnea (G47.33) - Nocturnal Hypoxemia (G47.36)  RECOMMENDATIONS - Suggest CPAP titration sleep study or autopap. Other options would be based on clinical judgment. - Be careful with alcohol, sedatives and other CNS depressants that may worsen sleep apnea and disrupt normal sleep architecture. - Sleep hygiene should be reviewed to assess factors that may improve sleep quality. - Weight management and regular exercise should be initiated or continued if appropriate.  [Electronically signed] 06/21/2021 12:16 PM  Jetty Duhamel MD, ABSM Diplomate, American Board of Sleep Medicine NPI: [id number]                         Jetty Duhamel Diplomate, American Board of Sleep Medicine  ELECTRONICALLY SIGNED ON:  06/21/2021, 12:12 PM [facility] PH: [phone]   FX: [phone] ACCREDITED BY THE AMERICAN ACADEMY OF SLEEP MEDICINE

## 2021-06-23 ENCOUNTER — Telehealth: Payer: Self-pay | Admitting: *Deleted

## 2021-06-23 NOTE — Telephone Encounter (Signed)
Patient called about his sleep study results.  He is needing to be cleared for knee surgery and would like to discuss this with Dr. Royal Piedra.  Will forward to MD. Burnard Hawthorne

## 2021-06-25 NOTE — Telephone Encounter (Signed)
Discussed surgical clearance with patient.  He has only been partially set up for CPAP but has not begun using it yet because he only has half of the device.  Cleared for surgery from every point of view except that I would like him to have started using the CPAP prior to getting surgery.  Advised patient to have orthopedic surgeon send clearance paperwork if needed to the office.  I do not need patient to have another visit prior to signing the paperwork but would like him to have started the CPAP beforehand.

## 2021-06-26 NOTE — Telephone Encounter (Signed)
Patient calls nurse line in regards to cpap supplies.   Please place order for cpap supplies according to recent sleep study.   Patient reports he only has the mask provided during sleep study.   Will forward to PCP.

## 2021-06-27 ENCOUNTER — Telehealth: Payer: Self-pay | Admitting: Family Medicine

## 2021-06-28 NOTE — Telephone Encounter (Signed)
Paperwork completed. To be faxed to surgeon at the beginning of the week.

## 2021-06-30 ENCOUNTER — Other Ambulatory Visit: Payer: Self-pay | Admitting: Student

## 2021-06-30 DIAGNOSIS — G4733 Obstructive sleep apnea (adult) (pediatric): Secondary | ICD-10-CM

## 2021-07-10 NOTE — Telephone Encounter (Signed)
Confirmation received from [employer].  

## 2021-07-10 NOTE — Telephone Encounter (Signed)
Community message sent to [employer] for processing.  

## 2021-07-11 NOTE — Progress Notes (Addendum)
COVID Vaccine Completed: yes x2  Date of COVID positive in last 90 days:  PCP - [name], DO Cardiologist -   Medical clearance by [name] 06/04/21 on chart  Chest x-ray -  EKG -  Stress Test -  ECHO -  Cardiac Cath -  Pacemaker/ICD device last checked: Spinal Cord Stimulator:  Bowel Prep -   Sleep Study -  CPAP -   Fasting Blood Sugar - pre DM Checks Blood Sugar _____ times a day  Blood Thinner Instructions: Aspirin Instructions: Last Dose:  Activity level:  Can go up a flight of stairs and perform activities of daily living without stopping and without symptoms of chest pain or shortness of breath.  Able to exercise without symptoms  Unable to go up a flight of stairs without symptoms of     Anesthesia review:   Patient denies shortness of breath, fever, cough and chest pain at PAT appointment  Patient verbalized understanding of instructions that were given to them at the PAT appointment. Patient was also instructed that they will need to review over the PAT instructions again at home before surgery.

## 2021-07-11 NOTE — Patient Instructions (Signed)
DUE TO COVID-19 ONLY TWO VISITORS  (aged 61 and older)  ARE ALLOWED TO COME WITH YOU AND STAY IN THE WAITING ROOM ONLY DURING PRE OP AND PROCEDURE.   **NO VISITORS ARE ALLOWED IN THE SHORT STAY AREA OR RECOVERY ROOM!!**   Your procedure is scheduled on: 07/18/21   Report to [facility] Main Entrance    Report to admitting at 11:00 AM   Call this number if you have problems the morning of surgery [phone]   Do not eat food :After Midnight.   After Midnight you may have the following liquids until 10:15 AM DAY OF SURGERY  Water Black Coffee (sugar ok, NO MILK/CREAM OR CREAMERS)  Tea (sugar ok, NO MILK/CREAM OR CREAMERS) regular and decaf                             Plain Jell-O (NO RED)                                           Fruit ices (not with fruit pulp, NO RED)                                     Popsicles (NO RED)                                                                  Juice: apple, WHITE grape, WHITE cranberry Sports drinks like Gatorade (NO RED) Clear broth(vegetable,chicken,beef)     The day of surgery:  Drink ONE (1) Pre-Surgery G2 at 10:15 AM the morning of surgery. Drink in one sitting. Do not sip.  This drink was given to you during your hospital  pre-op appointment visit. Nothing else to drink after completing the  Pre-Surgery G2.          If you have questions, please contact your surgeon's office.   FOLLOW BOWEL PREP AND ANY ADDITIONAL PRE OP INSTRUCTIONS YOU RECEIVED FROM YOUR SURGEON'S OFFICE!!!     Oral Hygiene is also important to reduce your risk of infection.                                    Remember - BRUSH YOUR TEETH THE MORNING OF SURGERY WITH YOUR REGULAR TOOTHPASTE   Take these medicines the morning of surgery with A SIP OF WATER: Bupropion, Zyrtec, Rosuvastatin, Tramadol  DO NOT TAKE ANY ORAL DIABETIC MEDICATIONS DAY OF YOUR SURGERY  How to Manage Your Diabetes Before and After Surgery  Why is it important to control  my blood sugar before and after surgery? Improving blood sugar levels before and after surgery helps healing and can limit problems. A way of improving blood sugar control is eating a healthy diet by:  Eating less sugar and carbohydrates  Increasing activity/exercise  Talking with your doctor about reaching your blood sugar goals High blood sugars (greater than 180 mg/dL) can raise your risk of infections and slow your recovery, so you  will need to focus on controlling your diabetes during the weeks before surgery. Make sure that the doctor who takes care of your diabetes knows about your planned surgery including the date and location.  How do I manage my blood sugar before surgery? Check your blood sugar at least 4 times a day, starting 2 days before surgery, to make sure that the level is not too high or low. Check your blood sugar the morning of your surgery when you wake up and every 2 hours until you get to the Short Stay unit. If your blood sugar is less than 70 mg/dL, you will need to treat for low blood sugar: Do not take insulin. Treat a low blood sugar (less than 70 mg/dL) with  cup of clear juice (cranberry or apple), 4 glucose tablets, OR glucose gel. Recheck blood sugar in 15 minutes after treatment (to make sure it is greater than 70 mg/dL). If your blood sugar is not greater than 70 mg/dL on recheck, call [phone] for further instructions. Report your blood sugar to the short stay nurse when you get to Short Stay.  If you are admitted to the hospital after surgery: Your blood sugar will be checked by the staff and you will probably be given insulin after surgery (instead of oral diabetes medicines) to make sure you have good blood sugar levels. The goal for blood sugar control after surgery is 80-180 mg/dL.  Reviewed and Endorsed by [facility] Patient Education Committee, August 2015   Bring CPAP mask and tubing day of surgery.                              You may not  have any metal on your body including jewelry, and body piercing             Do not wear lotions, powders, cologne, or deodorant              Men may shave face and neck.   Do not bring valuables to the hospital. [facility] IS NOT             RESPONSIBLE   FOR VALUABLES.   Bring small overnight bag day of surgery.   DO NOT BRING YOUR HOME MEDICATIONS TO THE HOSPITAL. PHARMACY WILL DISPENSE MEDICATIONS LISTED ON YOUR MEDICATION LIST TO YOU DURING YOUR ADMISSION IN THE HOSPITAL!    Patients discharged on the day of surgery will not be allowed to drive home.  Someone NEEDS to stay with you for the first 24 hours after anesthesia.   Special Instructions: Bring a copy of your healthcare power of attorney and living will documents         the day of surgery if you haven't scanned them before.              Please read over the following fact sheets you were given: IF YOU HAVE QUESTIONS ABOUT YOUR PRE-OP INSTRUCTIONS PLEASE CALL [phone]- [facility] - Preparing for Surgery Before surgery, you can play an important role.  Because skin is not sterile, your skin needs to be as free of germs as possible.  You can reduce the number of germs on your skin by washing with CHG (chlorahexidine gluconate) soap before surgery.  CHG is an antiseptic cleaner which kills germs and bonds with the skin to continue killing germs even after washing. Please DO NOT use if you have  an allergy to CHG or antibacterial soaps.  If your skin becomes reddened/irritated stop using the CHG and inform your nurse when you arrive at Short Stay. Do not shave (including legs and underarms) for at least 48 hours prior to the first CHG shower.  You may shave your face/neck.  Please follow these instructions carefully:  1.  Shower with CHG Soap the night before surgery and the  morning of surgery.  2.  If you choose to wash your hair, wash your hair first as usual with your normal  shampoo.  3.  After you shampoo,  rinse your hair and body thoroughly to remove the shampoo.                             4.  Use CHG as you would any other liquid soap.  You can apply chg directly to the skin and wash.  Gently with a scrungie or clean washcloth.  5.  Apply the CHG Soap to your body ONLY FROM THE NECK DOWN.   Do   not use on face/ open                           Wound or open sores. Avoid contact with eyes, ears mouth and   genitals (private parts).                       Wash face,  Genitals (private parts) with your normal soap.             6.  Wash thoroughly, paying special attention to the area where your    surgery  will be performed.  7.  Thoroughly rinse your body with warm water from the neck down.  8.  DO NOT shower/wash with your normal soap after using and rinsing off the CHG Soap.                9.  Pat yourself dry with a clean towel.            10.  Wear clean pajamas.            11.  Place clean sheets on your bed the night of your first shower and do not  sleep with pets. Day of Surgery : Do not apply any lotions/deodorants the morning of surgery.  Please wear clean clothes to the hospital/surgery center.  FAILURE TO FOLLOW THESE INSTRUCTIONS MAY RESULT IN THE CANCELLATION OF YOUR SURGERY  PATIENT SIGNATURE_________________________________  NURSE SIGNATURE__________________________________  ________________________________________________________________________   Jerry Stein  An incentive spirometer is a tool that can help keep your lungs clear and active. This tool measures how well you are filling your lungs with each breath. Taking long deep breaths may help reverse or decrease the chance of developing breathing (pulmonary) problems (especially infection) following: A long period of time when you are unable to move or be active. BEFORE THE PROCEDURE  If the spirometer includes an indicator to show your best effort, your nurse or respiratory therapist will set it to a desired  goal. If possible, sit up straight or lean slightly forward. Try not to slouch. Hold the incentive spirometer in an upright position. INSTRUCTIONS FOR USE  Sit on the edge of your bed if possible, or sit up as far as you can in bed or on a chair. Hold the incentive spirometer in an upright position. Breathe  out normally. Place the mouthpiece in your mouth and seal your lips tightly around it. Breathe in slowly and as deeply as possible, raising the piston or the ball toward the top of the column. Hold your breath for 3-5 seconds or for as long as possible. Allow the piston or ball to fall to the bottom of the column. Remove the mouthpiece from your mouth and breathe out normally. Rest for a few seconds and repeat Steps 1 through 7 at least 10 times every 1-2 hours when you are awake. Take your time and take a few normal breaths between deep breaths. The spirometer may include an indicator to show your best effort. Use the indicator as a goal to work toward during each repetition. After each set of 10 deep breaths, practice coughing to be sure your lungs are clear. If you have an incision (the cut made at the time of surgery), support your incision when coughing by placing a pillow or rolled up towels firmly against it. Once you are able to get out of bed, walk around indoors and cough well. You may stop using the incentive spirometer when instructed by your caregiver.  RISKS AND COMPLICATIONS Take your time so you do not get dizzy or light-headed. If you are in pain, you may need to take or ask for pain medication before doing incentive spirometry. It is harder to take a deep breath if you are having pain. AFTER USE Rest and breathe slowly and easily. It can be helpful to keep track of a log of your progress. Your caregiver can provide you with a simple table to help with this. If you are using the spirometer at home, follow these instructions: SEEK MEDICAL CARE IF:  You are having difficultly  using the spirometer. You have trouble using the spirometer as often as instructed. Your pain medication is not giving enough relief while using the spirometer. You develop fever of 100.5 F (38.1 C) or higher. SEEK IMMEDIATE MEDICAL CARE IF:  You cough up bloody sputum that had not been present before. You develop fever of 102 F (38.9 C) or greater. You develop worsening pain at or near the incision site. MAKE SURE YOU:  Understand these instructions. Will watch your condition. Will get help right away if you are not doing well or get worse. Document Released: 05/04/2006 Document Revised: 03/16/2011 Document Reviewed: 07/05/2006 [facility] Patient Information 2014 [place], [place].   ________________________________________________________________________

## 2021-07-14 ENCOUNTER — Other Ambulatory Visit: Payer: Self-pay | Admitting: Student

## 2021-07-14 DIAGNOSIS — R5383 Other fatigue: Secondary | ICD-10-CM

## 2021-07-14 DIAGNOSIS — R4589 Other symptoms and signs involving emotional state: Secondary | ICD-10-CM

## 2021-07-15 ENCOUNTER — Encounter (HOSPITAL_COMMUNITY): Payer: Self-pay

## 2021-07-15 ENCOUNTER — Encounter (HOSPITAL_COMMUNITY)
Admission: RE | Admit: 2021-07-15 | Discharge: 2021-07-15 | Disposition: A | Discharge status: Home / Self Care | Payer: BC Managed Care – PPO | Source: Ambulatory Visit | Attending: Orthopedic Surgery | Admitting: Orthopedic Surgery

## 2021-07-15 VITALS — BP 130/101 | HR 91 | Temp 98.1°F | Resp 14 | Ht 78.0 in | Wt 309.0 lb

## 2021-07-15 DIAGNOSIS — R7303 Prediabetes: Secondary | ICD-10-CM | POA: Insufficient documentation

## 2021-07-15 DIAGNOSIS — Y990 Civilian activity done for income or pay: Secondary | ICD-10-CM | POA: Diagnosis not present

## 2021-07-15 DIAGNOSIS — M1711 Unilateral primary osteoarthritis, right knee: Secondary | ICD-10-CM | POA: Diagnosis not present

## 2021-07-15 DIAGNOSIS — S83231A Complex tear of medial meniscus, current injury, right knee, initial encounter: Secondary | ICD-10-CM | POA: Diagnosis not present

## 2021-07-15 DIAGNOSIS — X58XXXA Exposure to other specified factors, initial encounter: Secondary | ICD-10-CM | POA: Diagnosis not present

## 2021-07-15 DIAGNOSIS — F32A Depression, unspecified: Secondary | ICD-10-CM | POA: Diagnosis not present

## 2021-07-15 DIAGNOSIS — Z01818 Encounter for other preprocedural examination: Secondary | ICD-10-CM | POA: Insufficient documentation

## 2021-07-15 DIAGNOSIS — I10 Essential (primary) hypertension: Secondary | ICD-10-CM | POA: Insufficient documentation

## 2021-07-15 DIAGNOSIS — Z87891 Personal history of nicotine dependence: Secondary | ICD-10-CM | POA: Diagnosis not present

## 2021-07-15 DIAGNOSIS — G4730 Sleep apnea, unspecified: Secondary | ICD-10-CM | POA: Diagnosis not present

## 2021-07-15 DIAGNOSIS — M94261 Chondromalacia, right knee: Secondary | ICD-10-CM | POA: Diagnosis not present

## 2021-07-15 HISTORY — DX: Pneumonia, unspecified organism: J18.9

## 2021-07-15 HISTORY — DX: Headache, unspecified: R51.9

## 2021-07-15 HISTORY — DX: Prediabetes: R73.03

## 2021-07-15 LAB — GLUCOSE, CAPILLARY: Glucose-Capillary: 127 mg/dL — ABNORMAL HIGH (ref 70–99)

## 2021-07-15 LAB — CBC
HCT: 45.3 % (ref 39.0–52.0)
Hemoglobin: 14.6 g/dL (ref 13.0–17.0)
MCH: 28.3 pg (ref 26.0–34.0)
MCHC: 32.2 g/dL (ref 30.0–36.0)
MCV: 87.8 fL (ref 80.0–100.0)
Platelets: 188 10*3/uL (ref 150–400)
RBC: 5.16 MIL/uL (ref 4.22–5.81)
RDW: 14.4 % (ref 11.5–15.5)
WBC: 5.1 10*3/uL (ref 4.0–10.5)
nRBC: 0 % (ref 0.0–0.2)

## 2021-07-15 LAB — BASIC METABOLIC PANEL
Anion gap: 5 (ref 5–15)
BUN: 19 mg/dL (ref 8–23)
CO2: 24 mmol/L (ref 22–32)
Calcium: 9.6 mg/dL (ref 8.9–10.3)
Chloride: 116 mmol/L — ABNORMAL HIGH (ref 98–111)
Creatinine, Ser: 1.04 mg/dL (ref 0.61–1.24)
GFR, Estimated: >60 mL/min (ref >60)
Glucose, Bld: 99 mg/dL (ref 70–99)
Potassium: 4.4 mmol/L (ref 3.5–5.1)
Sodium: 145 mmol/L (ref 135–145)

## 2021-07-15 LAB — NO BLOOD PRODUCTS

## 2021-07-18 ENCOUNTER — Ambulatory Visit (HOSPITAL_COMMUNITY)
Admission: RE | Admit: 2021-07-18 | Discharge: 2021-07-18 | Disposition: A | Discharge status: Home / Self Care | Payer: BC Managed Care – PPO | Attending: Orthopedic Surgery | Admitting: Orthopedic Surgery

## 2021-07-18 ENCOUNTER — Ambulatory Visit (HOSPITAL_COMMUNITY): Payer: BC Managed Care – PPO | Admitting: Physician Assistant

## 2021-07-18 ENCOUNTER — Ambulatory Visit (HOSPITAL_COMMUNITY): Payer: BC Managed Care – PPO | Admitting: Anesthesiology

## 2021-07-18 ENCOUNTER — Encounter (HOSPITAL_COMMUNITY)
Admission: RE | Disposition: A | Discharge status: Home / Self Care | Payer: Self-pay | Source: Home / Self Care | Attending: Orthopedic Surgery

## 2021-07-18 ENCOUNTER — Encounter (HOSPITAL_COMMUNITY): Payer: Self-pay | Admitting: Orthopedic Surgery

## 2021-07-18 ENCOUNTER — Other Ambulatory Visit: Payer: Self-pay

## 2021-07-18 DIAGNOSIS — Y990 Civilian activity done for income or pay: Secondary | ICD-10-CM | POA: Insufficient documentation

## 2021-07-18 DIAGNOSIS — S83231A Complex tear of medial meniscus, current injury, right knee, initial encounter: Secondary | ICD-10-CM | POA: Insufficient documentation

## 2021-07-18 DIAGNOSIS — M94261 Chondromalacia, right knee: Secondary | ICD-10-CM | POA: Diagnosis not present

## 2021-07-18 DIAGNOSIS — X58XXXA Exposure to other specified factors, initial encounter: Secondary | ICD-10-CM | POA: Diagnosis not present

## 2021-07-18 DIAGNOSIS — M65861 Other synovitis and tenosynovitis, right lower leg: Secondary | ICD-10-CM | POA: Diagnosis not present

## 2021-07-18 DIAGNOSIS — R7303 Prediabetes: Secondary | ICD-10-CM

## 2021-07-18 DIAGNOSIS — F32A Depression, unspecified: Secondary | ICD-10-CM | POA: Insufficient documentation

## 2021-07-18 DIAGNOSIS — M1711 Unilateral primary osteoarthritis, right knee: Secondary | ICD-10-CM | POA: Insufficient documentation

## 2021-07-18 DIAGNOSIS — I10 Essential (primary) hypertension: Secondary | ICD-10-CM | POA: Insufficient documentation

## 2021-07-18 DIAGNOSIS — G4730 Sleep apnea, unspecified: Secondary | ICD-10-CM | POA: Diagnosis not present

## 2021-07-18 DIAGNOSIS — Z87891 Personal history of nicotine dependence: Secondary | ICD-10-CM | POA: Diagnosis not present

## 2021-07-18 HISTORY — PX: KNEE ARTHROSCOPY WITH MEDIAL MENISECTOMY: SHX5651

## 2021-07-18 SURGERY — ARTHROSCOPY, KNEE, WITH MEDIAL MENISCECTOMY
Anesthesia: General | Site: Knee | Laterality: Right

## 2021-07-18 MED ORDER — FENTANYL CITRATE (PF) 250 MCG/5ML IJ SOLN
INTRAMUSCULAR | Status: AC
  Filled 2021-07-18: qty 5

## 2021-07-18 MED ORDER — PROPOFOL 10 MG/ML IV BOLUS
INTRAVENOUS | Status: AC
  Filled 2021-07-18: qty 20

## 2021-07-18 MED ORDER — SODIUM CHLORIDE 0.9 % IR SOLN
Status: DC | PRN
  Administered 2021-07-18: 1000 mL

## 2021-07-18 MED ORDER — CEFAZOLIN IN SODIUM CHLORIDE 3-0.9 GM/100ML-% IV SOLN
3.0000 g | INTRAVENOUS | Status: AC
Start: 2021-07-18 — End: 2021-07-18
  Administered 2021-07-18: 3 g via INTRAVENOUS
  Filled 2021-07-18: qty 100

## 2021-07-18 MED ORDER — ONDANSETRON HCL 4 MG/2ML IJ SOLN
INTRAMUSCULAR | Status: DC | PRN
  Administered 2021-07-18: 4 mg via INTRAVENOUS

## 2021-07-18 MED ORDER — PROPOFOL 10 MG/ML IV BOLUS
INTRAVENOUS | Status: DC | PRN
  Administered 2021-07-18: 200 mg via INTRAVENOUS

## 2021-07-18 MED ORDER — FENTANYL CITRATE PF 50 MCG/ML IJ SOSY
PREFILLED_SYRINGE | INTRAMUSCULAR | Status: AC
  Administered 2021-07-18: 50 ug via INTRAVENOUS
  Filled 2021-07-18: qty 2

## 2021-07-18 MED ORDER — DEXAMETHASONE SODIUM PHOSPHATE 10 MG/ML IJ SOLN
INTRAMUSCULAR | Status: DC | PRN
  Administered 2021-07-18: 10 mg via INTRAVENOUS

## 2021-07-18 MED ORDER — HYDROCODONE-ACETAMINOPHEN 7.5-325 MG PO TABS: 1.0000 | ORAL_TABLET | Freq: Four times a day (QID) | ORAL | 0 refills | Status: DC | PRN

## 2021-07-18 MED ORDER — FENTANYL CITRATE (PF) 100 MCG/2ML IJ SOLN
INTRAMUSCULAR | Status: AC
  Filled 2021-07-18: qty 2

## 2021-07-18 MED ORDER — OXYCODONE HCL 5 MG PO TABS: 5.0000 mg | ORAL_TABLET | Freq: Once | ORAL | Status: AC | PRN

## 2021-07-18 MED ORDER — FENTANYL CITRATE PF 50 MCG/ML IJ SOSY
25.0000 ug | PREFILLED_SYRINGE | INTRAMUSCULAR | Status: DC | PRN
  Administered 2021-07-18: 50 ug via INTRAVENOUS

## 2021-07-18 MED ORDER — CHLORHEXIDINE GLUCONATE 0.12 % MT SOLN
15.0000 mL | Freq: Once | OROMUCOSAL | Status: AC
  Administered 2021-07-18: 15 mL via OROMUCOSAL

## 2021-07-18 MED ORDER — ONDANSETRON HCL 4 MG PO TABS: 4.0000 mg | ORAL_TABLET | Freq: Three times a day (TID) | ORAL | 0 refills | Status: DC | PRN

## 2021-07-18 MED ORDER — FENTANYL CITRATE PF 50 MCG/ML IJ SOSY
PREFILLED_SYRINGE | INTRAMUSCULAR | Status: AC
  Administered 2021-07-18: 50 ug via INTRAVENOUS
  Filled 2021-07-18: qty 1

## 2021-07-18 MED ORDER — MIDAZOLAM HCL 2 MG/2ML IJ SOLN
INTRAMUSCULAR | Status: AC
  Filled 2021-07-18: qty 2

## 2021-07-18 MED ORDER — LACTATED RINGERS IV SOLN: INTRAVENOUS | Status: DC

## 2021-07-18 MED ORDER — PHENYLEPHRINE 80 MCG/ML (10ML) SYRINGE FOR IV PUSH (FOR BLOOD PRESSURE SUPPORT)
PREFILLED_SYRINGE | INTRAVENOUS | Status: DC | PRN
  Administered 2021-07-18 (×2): 80 ug via INTRAVENOUS

## 2021-07-18 MED ORDER — BUPIVACAINE HCL 0.25 % IJ SOLN
INTRAMUSCULAR | Status: DC | PRN
  Administered 2021-07-18: 30 mL

## 2021-07-18 MED ORDER — FENTANYL CITRATE PF 50 MCG/ML IJ SOSY
50.0000 ug | PREFILLED_SYRINGE | INTRAMUSCULAR | Status: DC
  Filled 2021-07-18: qty 2

## 2021-07-18 MED ORDER — OXYCODONE HCL 5 MG PO TABS
ORAL_TABLET | ORAL | Status: AC
  Administered 2021-07-18: 5 mg via ORAL
  Filled 2021-07-18: qty 1

## 2021-07-18 MED ORDER — FENTANYL CITRATE (PF) 100 MCG/2ML IJ SOLN
INTRAMUSCULAR | Status: DC | PRN
  Administered 2021-07-18: 100 ug via INTRAVENOUS
  Administered 2021-07-18: 50 ug via INTRAVENOUS

## 2021-07-18 MED ORDER — ACETAMINOPHEN 500 MG PO TABS
1000.0000 mg | ORAL_TABLET | Freq: Once | ORAL | Status: AC
  Administered 2021-07-18: 1000 mg via ORAL
  Filled 2021-07-18: qty 2

## 2021-07-18 MED ORDER — LIDOCAINE 2% (20 MG/ML) 5 ML SYRINGE
INTRAMUSCULAR | Status: DC | PRN
  Administered 2021-07-18: 100 mg via INTRAVENOUS

## 2021-07-18 MED ORDER — MIDAZOLAM HCL 2 MG/2ML IJ SOLN
1.0000 mg | INTRAMUSCULAR | Status: DC
  Filled 2021-07-18: qty 2

## 2021-07-18 MED ORDER — MIDAZOLAM HCL 5 MG/5ML IJ SOLN
INTRAMUSCULAR | Status: DC | PRN
  Administered 2021-07-18: 2 mg via INTRAVENOUS

## 2021-07-18 MED ORDER — BUPIVACAINE HCL (PF) 0.25 % IJ SOLN
INTRAMUSCULAR | Status: AC
  Filled 2021-07-18: qty 30

## 2021-07-18 MED ORDER — ORAL CARE MOUTH RINSE: 15.0000 mL | Freq: Once | OROMUCOSAL | Status: AC

## 2021-07-18 SURGICAL SUPPLY — 37 items
BAG COUNTER SPONGE SURGICOUNT (BAG) ×2 IMPLANT
BLADE SHAVER TORPEDO 4X13 (MISCELLANEOUS) ×2 IMPLANT
BNDG ELASTIC 6X5.8 VLCR STR LF (GAUZE/BANDAGES/DRESSINGS) ×2 IMPLANT
CLSR STERI-STRIP ANTIMIC 1/2X4 (GAUZE/BANDAGES/DRESSINGS) ×1 IMPLANT
COVER SURGICAL LIGHT HANDLE (MISCELLANEOUS) ×2 IMPLANT
CUFF TOURN SGL QUICK 34 (TOURNIQUET CUFF)
CUFF TRNQT CYL 34X4.125X (TOURNIQUET CUFF) IMPLANT
DRAPE ARTHROSCOPY W/POUCH 114 (DRAPES) ×2 IMPLANT
DRAPE SHEET LG 3/4 BI-LAMINATE (DRAPES) IMPLANT
DRAPE U-SHAPE 47X51 STRL (DRAPES) ×2 IMPLANT
DRSG PAD ABDOMINAL 8X10 ST (GAUZE/BANDAGES/DRESSINGS) ×3 IMPLANT
DURAPREP 26ML APPLICATOR (WOUND CARE) ×2 IMPLANT
GAUZE 4X4 16PLY ~~LOC~~+RFID DBL (SPONGE) ×2 IMPLANT
GAUZE SPONGE 4X4 12PLY STRL (GAUZE/BANDAGES/DRESSINGS) ×2 IMPLANT
GLOVE BIO SURGEON STRL SZ7.5 (GLOVE) ×4 IMPLANT
GLOVE BIOGEL PI IND STRL 8 (GLOVE) ×2 IMPLANT
GLOVE BIOGEL PI INDICATOR 8 (GLOVE) ×2
GOWN STRL REUS W/ TWL XL LVL3 (GOWN DISPOSABLE) ×2 IMPLANT
GOWN STRL REUS W/TWL XL LVL3 (GOWN DISPOSABLE) ×2
IV NS IRRIG 3000ML ARTHROMATIC (IV SOLUTION) ×4 IMPLANT
KIT BASIN OR (CUSTOM PROCEDURE TRAY) ×2 IMPLANT
KIT TURNOVER KIT A (KITS) ×1 IMPLANT
MANIFOLD NEPTUNE II (INSTRUMENTS) ×2 IMPLANT
PACK ARTHROSCOPY WL (CUSTOM PROCEDURE TRAY) ×2 IMPLANT
PAD CAST 4YDX4 CTTN HI CHSV (CAST SUPPLIES) IMPLANT
PADDING CAST COTTON 4X4 STRL (CAST SUPPLIES) ×1
PADDING CAST COTTON 6X4 STRL (CAST SUPPLIES) ×1 IMPLANT
PORT APPOLLO RF 90DEGREE MULTI (SURGICAL WAND) IMPLANT
PROBE HOOK APOLLO (SURGICAL WAND) IMPLANT
STRIP CLOSURE SKIN 1/2X4 (GAUZE/BANDAGES/DRESSINGS) ×2 IMPLANT
SUT ETHILON 4 0 PS 2 18 (SUTURE) IMPLANT
SUT MNCRL AB 3-0 PS2 18 (SUTURE) ×2 IMPLANT
TOWEL OR 17X26 10 PK STRL BLUE (TOWEL DISPOSABLE) ×2 IMPLANT
TUBING ARTHROSCOPY IRRIG 16FT (MISCELLANEOUS) ×2 IMPLANT
WAND APOLLORF SJ50 AR-9845 (SURGICAL WAND) IMPLANT
WATER STERILE IRR 500ML POUR (IV SOLUTION) ×2 IMPLANT
WRAP KNEE MAXI GEL POST OP (GAUZE/BANDAGES/DRESSINGS) ×2 IMPLANT

## 2021-07-18 NOTE — Transfer of Care (Signed)
Immediate Anesthesia Transfer of Care Note  Patient: Jerry Stein  Procedure(s) Performed: KNEE ARTHROSCOPY WITH PARTIAL MEDIAL MENISECTOMY (Right: Knee)  Patient Location: PACU  Anesthesia Type:General  Level of Consciousness: awake, alert  and oriented  Airway & Oxygen Therapy: Patient Spontanous Breathing and Patient connected to face mask oxygen  Post-op Assessment: Report given to RN and Post -op Vital signs reviewed and stable  Post vital signs: Reviewed and stable  Last Vitals:  Vitals Value Taken Time  BP 148/98 07/18/21 1415  Temp    Pulse 77 07/18/21 1415  Resp 16 07/18/21 1415  SpO2 100 % 07/18/21 1415  Vitals shown include unvalidated device data.  Last Pain:  Vitals:   07/18/21 1059  TempSrc:   PainSc: 3       Patients Stated Pain Goal: 4 (07/18/21 1059)  Complications: No notable events documented.

## 2021-07-18 NOTE — Discharge Instructions (Signed)

## 2021-07-18 NOTE — H&P (Signed)
ORTHOPAEDIC H and P  REQUESTING PHYSICIAN: Yolonda Kida, MD  PCP:  [name], DO  Chief Complaint: Right knee meniscus tear medial.  HPI: Jerry Stein is a 61 y.o. male who complains of increasing right knee pain after an injury at work.  This resulted in recalcitrant pain that ultimately resulted in necessitating an MRI.  That MRI did show a complex tear of the medial meniscus.  He is here today for arthroscopic intervention.  No new complaints today.  He has yet to be able to return to work since May.  Past Medical History:  Diagnosis Date   Allergy    HEYFEVER,POLLEN   Anxiety    Arthritis    Cataract    MILD,BILATERAL   Clotting disorder (HCC)    CLOT RETINA,LEFT 47 YEARS AGO,DUE TO EYE INJURY   Depression    Headache    migraine   High blood pressure 05/25/2017   Hyperlipidemia 06/09/2017   Pneumonia    Pre-diabetes    Sleep apnea    no cpap   Substance abuse (HCC) 1979   once    Past Surgical History:  Procedure Laterality Date   COLONOSCOPY  2010   In [place]   COLONOSCOPY W/ POLYPECTOMY  2012   5 benign polyps    EYE SURGERY Left 1976   blood clot in retina, removed, 1 month of vision loss   KNEE CARTILAGE SURGERY Left 1998   Social History   Socioeconomic History   Marital status: Married    Spouse name: Kyng Matlock    Number of children: 2   Years of education: HS grad    Highest education level: Not on file  Occupational History   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   Tobacco Use   Smoking status: Former    Packs/day: 0.30    Types: Cigarettes    Start date: 1998    Quit date: 12/19/2017    Years since quitting: 3.5    Passive exposure: Current (WIFE JUST QUIT UPDATE 03/20/21)   Smokeless tobacco: Never   Tobacco comments:    trying to quit  Vaping Use   Vaping Use: Never used  Substance and Sexual Activity   Alcohol use: Yes    Comment: every other month   Drug use: Not  Currently   Sexual activity: Not Currently    Comment: with Royetta Crochet   Other Topics Concern   Not on file  Social History Narrative   Lives with wife and one step child Baird Kay).   No pets.    Jehovah's witness (does not accept blood products). Will accept iron.     (wife attends [employer]).    Social Determinants of Health   Financial Resource Strain: Not on file  Food Insecurity: Not on file  Transportation Needs: Not on file  Physical Activity: Not on file  Stress: Not on file  Social Connections: Not on file   Family History  Problem Relation Age of Onset   Kidney disease Mother        died of renal failure    Heart disease Mother        had pacemaker, took NTG.    Cancer Sister 79       breast cancer    Alcohol abuse Brother    Cirrhosis Brother        related to alcoholism   Diabetes Brother    Cancer Maternal  Grandmother        uterine cancer    Uterine cancer Maternal Grandmother    Hypertension Neg Hx    Colon cancer Neg Hx    Colon polyps Neg Hx    Esophageal cancer Neg Hx    Rectal cancer Neg Hx    Stomach cancer Neg Hx    Allergies  Allergen Reactions   Lisinopril Cough   Shrimp [Shellfish Allergy] Swelling   Prior to Admission medications   Medication Sig Start Date End Date Taking? Authorizing Provider  buPROPion (WELLBUTRIN XL) 150 MG 24 hr tablet Take 1 tablet (150 mg total) by mouth daily. 07/15/21  Yes [name], DO  cetirizine (ZYRTEC) 10 MG tablet Take 1 tablet (10 mg total) by mouth daily. Patient taking differently: Take 10 mg by mouth daily as needed for allergies. 07/15/18  Yes Winfrey, Harlen Labs, MD  fluticasone (FLONASE) 50 MCG/ACT nasal spray Place 2 sprays into both nostrils at bedtime. Patient taking differently: Place 2 sprays into both nostrils every other day. 02/14/21  Yes [name], DO  losartan (COZAAR) 50 MG tablet Take 1 tablet (50 mg total) by mouth daily. 07/22/20  Yes Maness, Loistine Chance, MD  rosuvastatin  (CRESTOR) 20 MG tablet Take 1 tablet (20 mg total) by mouth daily. 02/24/21 02/24/22 Yes [name], DO  topiramate (TOPAMAX) 50 MG tablet Take 50 mg by mouth at bedtime.   Yes [provider]  traMADol (ULTRAM) 50 MG tablet Take 50 mg by mouth every 6 (six) hours as needed for pain. 05/27/21  Yes [provider]  naproxen sodium (ALEVE) 220 MG tablet Take 220 mg by mouth daily as needed (pain). Patient not taking: Reported on 07/03/2021    [provider]   No results found.  Positive ROS: All other systems have been reviewed and were otherwise negative with the exception of those mentioned in the HPI and as above.  Physical Exam: General: Alert, no acute distress Cardiovascular: No pedal edema Respiratory: No cyanosis, no use of accessory musculature GI: No organomegaly, abdomen is soft and non-tender Skin: No lesions in the area of chief complaint Neurologic: Sensation intact distally Psychiatric: Patient is competent for consent with normal mood and affect Lymphatic: No axillary or cervical lymphadenopathy  MUSCULOSKELETAL:  Right lower extremity is warm and well-perfused.  No open wounds or lesions.  Neurovascular intact distal.  Assessment: 1.  Right knee acute medial meniscus tear, complex.  Plan: -Plan to proceed today with arthroscopic intervention on this right knee.  We discussed arthroscopic partial medial meniscectomy with chondroplasty.  We discussed the risk of bleeding, infection, damage to surrounding nerves and vessels, stiffness, failure repair, failure of pain relief, need for future surgery as well as progression of arthritis and the risk of DVT and the risk of anesthesia.  He has provided informed consent.  -Plan for discharge home postoperatively from PACU.    Yolonda Kida, MD Cell [phone]    07/18/2021 12:29 PM

## 2021-07-18 NOTE — Anesthesia Procedure Notes (Signed)
Procedure Name: LMA Insertion Date/Time: 07/18/2021 1:22 PM  Performed by: Kizzie Fantasia, CRNAPre-anesthesia Checklist: Patient identified, Emergency Drugs available, Suction available, Patient being monitored and Timeout performed Patient Re-evaluated:Patient Re-evaluated prior to induction Oxygen Delivery Method: Circle system utilized Preoxygenation: Pre-oxygenation with 100% oxygen Induction Type: IV induction Ventilation: Mask ventilation without difficulty LMA: LMA inserted LMA Size: 5.0 Number of attempts: 1 Placement Confirmation: positive ETCO2 and breath sounds checked- equal and bilateral Tube secured with: Tape Dental Injury: Teeth and Oropharynx as per pre-operative assessment

## 2021-07-18 NOTE — Anesthesia Preprocedure Evaluation (Addendum)
Anesthesia Evaluation  Patient identified by MRN, date of birth, ID band Patient awake    Reviewed: Allergy & Precautions, NPO status , Patient's Chart, lab work & pertinent test results  Airway Mallampati: III  TM Distance: >3 FB Neck ROM: Full  Mouth opening: Limited Mouth Opening  Dental no notable dental hx. (+) Teeth Intact, Dental Advisory Given   Pulmonary sleep apnea (no CPAP) , former smoker,    Pulmonary exam normal breath sounds clear to auscultation       Cardiovascular hypertension, Pt. on medications Normal cardiovascular exam Rhythm:Regular Rate:Normal     Neuro/Psych  Headaches, PSYCHIATRIC DISORDERS Anxiety Depression    GI/Hepatic negative GI ROS, Neg liver ROS,   Endo/Other  negative endocrine ROS  Renal/GU negative Renal ROS  negative genitourinary   Musculoskeletal  (+) Arthritis ,   Abdominal   Peds  Hematology  (+) REFUSES BLOOD PRODUCTS (will accept albumin), JEHOVAH'S WITNESS  Anesthesia Other Findings   Reproductive/Obstetrics                           Anesthesia Physical Anesthesia Plan  ASA: 3  Anesthesia Plan: General   Post-op Pain Management: Tylenol PO (pre-op)*   Induction: Intravenous  PONV Risk Score and Plan: 2 and Ondansetron, Dexamethasone and Midazolam  Airway Management Planned: LMA  Additional Equipment:   Intra-op Plan:   Post-operative Plan: Extubation in OR  Informed Consent: I have reviewed the patients History and Physical, chart, labs and discussed the procedure including the risks, benefits and alternatives for the proposed anesthesia with the patient or authorized representative who has indicated his/her understanding and acceptance.     Dental advisory given  Plan Discussed with: CRNA  Anesthesia Plan Comments:         Anesthesia Quick Evaluation

## 2021-07-18 NOTE — Op Note (Signed)
Surgery Date: 07/18/2021  Surgeon(s): Yolonda Kida, MD  Assistant:  Derryl Harbor, RNFA  ANESTHESIA:  general  FLUIDS: Per anesthesia record.   ESTIMATED BLOOD LOSS: minimal  PREOPERATIVE DIAGNOSES:  1.  Right knee complex medial meniscus tear 2.  knee synovitis  POSTOPERATIVE DIAGNOSES:  1.  Right knee complex medial meniscus tear 2.  Right knee patellofemoral osteoarthritis 3.  Right knee chondromalacia medial femoral condyle  PROCEDURES PERFORMED:  1.  Right knee arthroscopy with limited synovectomy 2. knee arthroscopy with arthroscopic partial medial meniscectomy 3. knee arthroscopy with arthroscopic chondroplasty medial femoral   DESCRIPTION OF PROCEDURE: Mr. Jerry Stein is a 61 y.o.-year-old male-year-old male with right knee medial meniscus tear. Plans are to proceed with partial medial meniscectomy and diagnostic arthroscopy with debridement as indicated. Full discussion held regarding risks benefits alternatives and complications related surgical intervention. Conservative care options reviewed. All questions answered.  The patient was identified in the preoperative holding area and the operative extremity was marked. The patient was brought to the operating room and transferred to operating table in a supine position. Satisfactory general anesthesia was induced by anesthesiology.    Standard anterolateral, anteromedial arthroscopy portals were obtained. The anteromedial portal was obtained with a spinal needle for localization under direct visualization with subsequent diagnostic findings.   Anteromedial and anterolateral chambers: moderate synovitis. The synovitis was debrided with a 4.5 mm full radius shaver through both the anteromedial and lateral portals.   Suprapatellar pouch and gutters: mild synovitis or debris. Patella chondral surface: Grade 3 Trochlear chondral surface: Grade 4 Patellofemoral tracking: Midline level Medial meniscus: Complex tear of the posterior horn  with a radial component at the posterior root as well as horizontal component moving more towards the posterior horn.  There was a flap fragment flipped inferiorly along the posterior aspect of the medial tibial plateau.  Medial femoral condyle flexion bearing surface: Grade 3, located on the lateral aspect of the medial femoral condyle adjacent to the intercondylar notch Medial femoral condyle extension bearing surface: Grade 0 Medial tibial plateau: Grade 0 Anterior cruciate ligament:stable Posterior cruciate ligament:stable Lateral meniscus: Intact.   Lateral femoral condyle flexion bearing surface: Grade 0 Lateral femoral condyle extension bearing surface: Grade 0 Lateral tibial plateau: Grade 2  Partial medial meniscectomy was carried out with combination of meniscal biter as well as arthroscopic shaver.  We contoured the posterior root tear away from the radial component.  We remove the short segment that was still attached to the root attachment at the tibial plateau.  Chondroplasty was completed with a combination of motorized shaver and meniscal biter to debulk the flap tear on the medial femoral condyle.  After completion of synovectomy, diagnostic exam, and debridements as described, all compartments were checked and no residual debris remained. Hemostasis was achieved with the cautery wand. The portals were approximated with buried monocryl. All excess fluid was expressed from the joint.  Xeroform sterile gauze dressings were applied followed by Ace bandage and ice pack.   DISPOSITION: The patient was awakened from general anesthetic, extubated, taken to the recovery room in medically stable condition, no apparent complications. The patient may be weightbearing as tolerated to the operative lower extremity.  Range of motion of right knee as tolerated.

## 2021-07-18 NOTE — Brief Op Note (Signed)
07/18/2021  1:56 PM  PATIENT:  Jerry Stein  61 y.o. male  PRE-OPERATIVE DIAGNOSIS: 1.  Right knee medial meniscus tear  POST-OPERATIVE DIAGNOSIS: 1.  Right knee medial meniscus tear 2.  Right knee patellofemoral osteoarthritis 3.  Right knee medial femoral condyle chondromalacia  PROCEDURE:  Procedure(s): Right KNEE ARTHROSCOPY WITH PARTIAL MEDIAL MENISECTOMY (Right) Right knee arthroscopic chondroplasty of medial femoral condyle  SURGEON:  Surgeon(s) and Role:    * Aundria Rud, Noah Delaine, MD - Primary  ASSISTANTS: Tomma Lightning, RNFA   ANESTHESIA:   local and general  EBL:  10 mL   BLOOD ADMINISTERED:none  DRAINS: none   LOCAL MEDICATIONS USED:  MARCAINE   SPECIMEN:  No Specimen  DISPOSITION OF SPECIMEN:  N/A  COUNTS:  YES  TOURNIQUET:  * No tourniquets in log *  DICTATION: .Note written in EPIC  PLAN OF CARE: Discharge to home after PACU  PATIENT DISPOSITION:  PACU - hemodynamically stable.   Delay start of Pharmacological VTE agent (>24hrs) due to surgical blood loss or risk of bleeding: not applicable

## 2021-07-20 ENCOUNTER — Encounter (HOSPITAL_COMMUNITY): Payer: Self-pay | Admitting: Orthopedic Surgery

## 2021-07-21 ENCOUNTER — Other Ambulatory Visit: Payer: Self-pay | Admitting: Student

## 2021-07-21 NOTE — Anesthesia Postprocedure Evaluation (Signed)
Anesthesia Post Note  Patient: Jerry Stein  Procedure(s) Performed: KNEE ARTHROSCOPY WITH PARTIAL MEDIAL MENISECTOMY (Right: Knee)     Patient location during evaluation: PACU Anesthesia Type: General Level of consciousness: awake and alert Pain management: pain level controlled Vital Signs Assessment: post-procedure vital signs reviewed and stable Respiratory status: spontaneous breathing, nonlabored ventilation, respiratory function stable and patient connected to nasal cannula oxygen Cardiovascular status: blood pressure returned to baseline and stable Postop Assessment: no apparent nausea or vomiting Anesthetic complications: no   No notable events documented.  Last Vitals:  Vitals:   07/18/21 1530 07/18/21 1557  BP: (!) 154/86 131/84  Pulse: 69 73  Resp: 12   Temp: 36.6 C   SpO2: 92% 94%    Last Pain:  Vitals:   07/18/21 1557  TempSrc:   PainSc: 3                  Wilmarie Sparlin L Mercades Bajaj

## 2021-07-21 NOTE — Telephone Encounter (Signed)
Please see below message from [facility] in regards to cpap order.    "We are working on this pt's CPAP order but we need the auto titration pressure settings added to the order please.  The most common range ordered is 5-20 cmH20."  Will forward to provider.

## 2021-07-25 ENCOUNTER — Other Ambulatory Visit: Payer: Self-pay | Admitting: *Deleted

## 2021-07-25 DIAGNOSIS — M25561 Pain in right knee: Secondary | ICD-10-CM | POA: Diagnosis not present

## 2021-07-25 DIAGNOSIS — I10 Essential (primary) hypertension: Secondary | ICD-10-CM

## 2021-07-27 MED ORDER — LOSARTAN POTASSIUM 50 MG PO TABS: 50.0000 mg | ORAL_TABLET | Freq: Every day | ORAL | 3 refills | Status: DC

## 2021-07-29 DIAGNOSIS — M25561 Pain in right knee: Secondary | ICD-10-CM | POA: Diagnosis not present

## 2021-07-30 ENCOUNTER — Encounter: Payer: Self-pay | Admitting: Student

## 2021-07-30 ENCOUNTER — Other Ambulatory Visit: Payer: Self-pay | Admitting: Student

## 2021-07-30 DIAGNOSIS — G4733 Obstructive sleep apnea (adult) (pediatric): Secondary | ICD-10-CM

## 2021-07-31 ENCOUNTER — Other Ambulatory Visit (HOSPITAL_COMMUNITY): Payer: Self-pay | Admitting: Orthopedic Surgery

## 2021-07-31 ENCOUNTER — Ambulatory Visit (HOSPITAL_COMMUNITY)
Admission: RE | Admit: 2021-07-31 | Discharge: 2021-07-31 | Disposition: A | Discharge status: Home / Self Care | Payer: BC Managed Care – PPO | Source: Ambulatory Visit | Attending: Cardiology | Admitting: Cardiology

## 2021-07-31 DIAGNOSIS — M7989 Other specified soft tissue disorders: Secondary | ICD-10-CM | POA: Diagnosis not present

## 2021-07-31 DIAGNOSIS — M79661 Pain in right lower leg: Secondary | ICD-10-CM

## 2021-08-01 DIAGNOSIS — M25561 Pain in right knee: Secondary | ICD-10-CM | POA: Diagnosis not present

## 2021-08-05 DIAGNOSIS — M25561 Pain in right knee: Secondary | ICD-10-CM | POA: Diagnosis not present

## 2021-08-12 DIAGNOSIS — Z4789 Encounter for other orthopedic aftercare: Secondary | ICD-10-CM | POA: Diagnosis not present

## 2021-08-14 DIAGNOSIS — M25561 Pain in right knee: Secondary | ICD-10-CM | POA: Diagnosis not present

## 2021-08-15 ENCOUNTER — Encounter: Payer: Self-pay | Admitting: Acute Care

## 2021-08-15 ENCOUNTER — Ambulatory Visit (INDEPENDENT_AMBULATORY_CARE_PROVIDER_SITE_OTHER): Payer: BC Managed Care – PPO | Admitting: Acute Care

## 2021-08-15 VITALS — BP 136/78 | HR 77 | Temp 98.0°F | Ht 77.0 in | Wt 312.4 lb

## 2021-08-15 DIAGNOSIS — I10 Essential (primary) hypertension: Secondary | ICD-10-CM

## 2021-08-15 DIAGNOSIS — G4733 Obstructive sleep apnea (adult) (pediatric): Secondary | ICD-10-CM | POA: Diagnosis not present

## 2021-08-15 DIAGNOSIS — G4730 Sleep apnea, unspecified: Secondary | ICD-10-CM | POA: Diagnosis not present

## 2021-08-15 NOTE — Progress Notes (Signed)
Reviewed and agree with assessment/plan.   Kaine Mcquillen, MD [facility] 08/15/2021, 3:14 PM Pager:  [phone]  

## 2021-08-15 NOTE — Progress Notes (Signed)
History of Present Illness Jerry Stein is a 61 y.o. male former smoker with past medical history of snoring and previous sleep study + for OSA, Primary HTN, depression, Anxiety, Hyperlipidemia,Obesity, Pre-diabetes, Fatigue, and recent right knee meniscus tear with partial medial meniscectomy and diagnostic arthroscopy with debridement as indicated. Referred for Sleep evaluation He has had sleep study in the past that indicated severe OSA with AHI of 45.8. He never wore CPAP. He is here to be re-evaluated as he continues to have symptoms of OSA.   08/15/2021 Pt. Presents for follow up after sleep study. Indication for study was Fatigue, Hypertension, Obesity, Re-Evaluation, Snoring Epworth Sleepiness Score: 15 today. His last sleep study in 2018  showed AHI of 45.8. He never started CPAP therapy. His most recent study shows AHI of 75.1, and desaturations to as low as 70%. He has had a weight gain of 65 pounds in 1.5 years. He needs a CPAP titration study, and CPAP initiation. He is frustrated that it has taken him so long to get a CPAP machine.( Although he is new to our practice, and had sleep study in June) . He endorses snoring, daytime sleepiness, fatigue, non-restorative sleep, and weigh gain.Sleep study endorses worsening AHI at 75.1. Recommendation is for split night sleep titration study which we will order. I will go ahead and place order for CPAP Auto Set 5-20 cm H2O so we can treat him while we wait for titration study to be completed. He said he had a mask fitting when he had his sleep study. He understands risks of untreated sleep apnea. He is in agreement to trying to make CPAP therapy work for him. His wife is here with him today. Pt. Appears sleepy at present, frequent yawning.      Sleep questionnaire>> 08/15/2021 Symptoms-  snoring, daytime sleepiness, headaches, HTN, fatigue Prior sleep study-  Yes Bedtime- After midnight Time to fall asleep- Not answered Nocturnal awakenings-  2-3 times per night Out of bed/start of day- Varies Weight changes- 65 pound weight gain over the last 1-1.5 years Do you operate heavy machinery- No Do you currently wear CPAP- No Do you current wear oxygen- No Epworth-  15    08/15/2021   11:27 AM  Results of the Epworth flowsheet  Sitting and reading 3  Watching TV 2  Sitting, inactive in a public place (e.g. a theatre or a meeting) 1  As a passenger in a car for an hour without a break 1  Lying down to rest in the afternoon when circumstances permit 2  Sitting and talking to someone 1  Sitting quietly after a lunch without alcohol 2  In a car, while stopped for a few minutes in traffic 3  Total score 15      Test Results: Sleep Study 06/16/2021 Neck Size 17.25 inches Severe obstructive sleep apnea occurred during this study (AHI = 75.1/h). Severe oxygen desaturation was noted during this study (Min O2 = 70.0%). Mean 89.8%, indicating nocturnal hypoxemia. EKG findings include PVCs Severe Sleep Apnea with AHI of 75.1 per hour Impressions   IMPRESSIONS - Severe obstructive sleep apnea occurred during this study (AHI = 75.1/h). - Insufficient early sleep and events to meet protocol requirements for split protocol CPAP titration. - Severe oxygen desaturation was noted during this study (Min O2 = 70.0%). Mean 89.8%, indicating nocturnal hypoxemia. - The patient snored with moderate snoring volume. - EKG findings include PVCs. - Clinically significant periodic limb movements did not occur during sleep. No significant  associated arousals.  Previous sleep study done 06/25/2024 indicated AHI of 45.8/h.    Latest Ref Rng & Units 07/15/2021   10:00 AM 02/14/2021    3:22 PM 03/10/2019    8:59 AM  CBC  WBC 4.0 - 10.5 K/uL 5.1  4.9  8.2   Hemoglobin 13.0 - 17.0 g/dL 02.5  42.7  06.2   Hematocrit 39.0 - 52.0 % 45.3  42.6  42.7   Platelets 150 - 400 K/uL 188  193  209        Latest Ref Rng & Units 07/15/2021   10:00 AM 06/04/2021    11:29 AM 03/21/2021    4:25 PM  BMP  Glucose 70 - 99 mg/dL 99  376  91   BUN 8 - 23 mg/dL 19  21  20    Creatinine 0.61 - 1.24 mg/dL  2.83  1.51   BUN/Creat Ratio 10 - 24  18  16    Sodium 135 - 145 mmol/L 145  139  142   Potassium 3.5 - 5.1 mmol/L 4.4  4.1  5.3   Chloride 98 - 111 mmol/L 116  107  105   CO2 22 - 32 mmol/L 24  19  26    Calcium 8.9 - 10.3 mg/dL 9.6  9.0  9.6     BNP No results found for: "BNP"  ProBNP No results found for: "PROBNP"  PFT No results found for: "FEV1PRE", "FEV1POST", "FVCPRE", "FVCPOST", "TLC", "DLCOUNC", "PREFEV1FVCRT", "PSTFEV1FVCRT"  VAS 7.61 LOWER EXTREMITY VENOUS (DVT)  Result Date: 07/31/2021  Lower Venous DVT Study Patient Name:  Candelario Hedtke  Date of Exam:   07/31/2021 Medical Rec #: [place]     Accession #:    08/02/2021 Date of Birth: 09/19/1960     Patient Gender: M Patient Age:   24 years Exam Location:  [facility] Procedure:      VAS [id number] LOWER EXTREMITY VENOUS (DVT) Referring Phys: 02/01/1960 ROGERS --------------------------------------------------------------------------------  Indications: Right calf pain and swelling. Patient had right meniscus arthroscopy 07/18/2021. On 07/22/2021 he felt a pop behind his right knee. Patient denies chest pain and SOB.  Comparison Study: None Performing Technologist: Alecia Mackin RVT, RDCS (AE), RDMS  Examination Guidelines: A complete evaluation includes B-mode imaging, spectral Doppler, color Doppler, and power Doppler as needed of all accessible portions of each vessel. Bilateral testing is considered an integral part of a complete examination. Limited examinations for reoccurring indications may be performed as noted. The reflux portion of the exam is performed with the patient in reverse Trendelenburg.  +---------+---------------+---------+-----------+----------+--------------+ RIGHT    CompressibilityPhasicitySpontaneityPropertiesThrombus Aging  +---------+---------------+---------+-----------+----------+--------------+ CFV      Full           Yes      Yes                                 +---------+---------------+---------+-----------+----------+--------------+ SFJ      Full           Yes      Yes                                 +---------+---------------+---------+-----------+----------+--------------+ FV Prox  Full           Yes      Yes                                 +---------+---------------+---------+-----------+----------+--------------+  FV Mid   Full           Yes      Yes                                 +---------+---------------+---------+-----------+----------+--------------+ FV DistalFull           Yes      Yes                                 +---------+---------------+---------+-----------+----------+--------------+ PFV      Full                                                        +---------+---------------+---------+-----------+----------+--------------+ POP      Full           Yes      Yes                                 +---------+---------------+---------+-----------+----------+--------------+ PTV      Full           Yes      Yes                                 +---------+---------------+---------+-----------+----------+--------------+ PERO     Full           Yes      Yes                                 +---------+---------------+---------+-----------+----------+--------------+ Gastroc  Full                                                        +---------+---------------+---------+-----------+----------+--------------+ GSV      Full           Yes      Yes                                 +---------+---------------+---------+-----------+----------+--------------+   +----+---------------+---------+-----------+----------+--------------+ LEFTCompressibilityPhasicitySpontaneityPropertiesThrombus Aging  +----+---------------+---------+-----------+----------+--------------+ CFV Full           Yes      Yes                                 +----+---------------+---------+-----------+----------+--------------+    Findings reported to [place], RN at Dr. Aundria Rud' office at 12:35 pm.  Summary: RIGHT: - No evidence of deep vein thrombosis in the lower extremity. No indirect evidence of obstruction proximal to the inguinal ligament. - No cystic structure found in the popliteal fossa. - 5.2 x 1.5 x 2.8 cm complex anechoic avascular structure within muscle in distal posterior thigh.  LEFT: - No evidence of common femoral vein obstruction.  *See table(s) above for measurements and observations. Electronically signed  by Charlton Haws MD on 07/31/2021 at 12:42:31 PM.    Final      Past medical hx Past Medical History:  Diagnosis Date   Allergy    HEYFEVER,POLLEN   Anxiety    Arthritis    Cataract    MILD,BILATERAL   Clotting disorder (HCC)    CLOT RETINA,LEFT 47 YEARS AGO,DUE TO EYE INJURY   Depression    Headache    migraine   High blood pressure 05/25/2017   Hyperlipidemia 06/09/2017   Pneumonia    Pre-diabetes    Sleep apnea    no cpap   Substance abuse (HCC) 1979   once      Social History   Tobacco Use   Smoking status: Former    Packs/day: 0.30    Types: Cigarettes    Start date: 1998    Quit date: 12/19/2017    Years since quitting: 3.6    Passive exposure: Current (WIFE JUST QUIT UPDATE 03/20/21)   Smokeless tobacco: Never   Tobacco comments:    trying to quit  Vaping Use   Vaping Use: Never used  Substance Use Topics   Alcohol use: Yes    Comment: every other month   Drug use: Not Currently    Mr.Bozard reports that he quit smoking about 3 years ago. His smoking use included cigarettes. He started smoking about 25 years ago. He smoked an average of .3 packs per day. He has been exposed to tobacco smoke. He has never used smokeless tobacco. He reports current alcohol use. He  reports that he does not currently use drugs.  Tobacco Cessation: Former smoker, quit 2019 with a 21 pack year smoking history ( Smoked from [id number])  Past surgical hx, Family hx, Social hx all reviewed.  Current Outpatient Medications on File Prior to Visit  Medication Sig   buPROPion (WELLBUTRIN XL) 150 MG 24 hr tablet Take 1 tablet (150 mg total) by mouth daily.   cetirizine (ZYRTEC) 10 MG tablet Take 1 tablet (10 mg total) by mouth daily. (Patient taking differently: Take 10 mg by mouth daily as needed for allergies.)   fluticasone (FLONASE) 50 MCG/ACT nasal spray Place 2 sprays into both nostrils at bedtime. (Patient taking differently: Place 2 sprays into both nostrils every other day.)   HYDROcodone-acetaminophen (NORCO) 7.5-325 MG tablet Take 1 tablet by mouth every 6 (six) hours as needed for moderate pain.   losartan (COZAAR) 50 MG tablet Take 1 tablet (50 mg total) by mouth daily.   naproxen sodium (ALEVE) 220 MG tablet Take 220 mg by mouth daily as needed (pain).   ondansetron (ZOFRAN) 4 MG tablet Take 1 tablet (4 mg total) by mouth every 8 (eight) hours as needed for nausea or vomiting.   rosuvastatin (CRESTOR) 20 MG tablet TAKE ONE TABLET BY MOUTH EVERY DAY   topiramate (TOPAMAX) 50 MG tablet Take 50 mg by mouth at bedtime.   traMADol (ULTRAM) 50 MG tablet Take 50 mg by mouth every 6 (six) hours as needed for pain.   Current Facility-Administered Medications on File Prior to Visit  Medication   0.9 %  sodium chloride infusion     Allergies  Allergen Reactions   Lisinopril Cough   Shrimp [Shellfish Allergy] Swelling    Review Of Systems:  Constitutional:   No  weight loss, + weight gain, night sweats,  Fevers, chills, +fatigue, or  lassitude.  HEENT:   No headaches,  Difficulty swallowing,  Tooth/dental problems, or  Sore throat,  No sneezing, itching, ear ache, nasal congestion, post nasal drip,   CV:  No chest pain,  Orthopnea, PND, swelling in  lower extremities, anasarca, dizziness, palpitations, syncope.   GI  No heartburn, indigestion, abdominal pain, nausea, vomiting, diarrhea, change in bowel habits, loss of appetite, bloody stools.   Resp: + shortness of breath with exertion less at rest.  No excess mucus, no productive cough,  No non-productive cough,  No coughing up of blood.  No change in color of mucus.  No wheezing.  No chest wall deformity  Skin: no rash or lesions.  GU: no dysuria, change in color of urine, no urgency or frequency.  No flank pain, no hematuria   MS:  + joint pain ( right knee)or swelling.  + decreased range of motion.  No back pain.  Psych:  No change in mood or affect. + depression  and anxiety.  No memory loss.   Vital Signs BP 136/78 (BP Location: Right Arm, Patient Position: Sitting, Cuff Size: Large)   Pulse 77   Temp 98 F (36.7 C) (Oral)   Ht 6\' 5"  (1.956 m)   Wt (!) 312 lb 6.4 oz (141.7 kg)   SpO2 96%   BMI 37.05 kg/m    Physical Exam:  General- No distress,  A&Ox3, agitated ENT: No sinus tenderness, TM clear, pale nasal mucosa, no oral exudate,no post nasal drip, no LAN Cardiac: S1, S2, regular rate and rhythm, no murmur Chest: No wheeze/ rales/ dullness; no accessory muscle use, no nasal flaring, no sternal retractions Abd.: Soft Non-tender, ND, BS +, Body mass index is 37.05 kg/m.  Ext: No clubbing cyanosis, trace bilateral LE edema, Right knee brace Neuro:  normal strength, MAE x 4, A&O x 3 Skin: No rashes, warm and dry, no lesions  Psych: normal mood and behavior   Assessment/Plan Sleep Study endorses AHI at 75.1 Severe OSA with oxygen desaturations nadir of 70% HTN currently controlled on Losartan Plan We will place an order for a split night study.  You will get a call to get this scheduled.  This will help determine the correct settings to manage your severe sleep apnea and oxygen desaturations.  We will place order for CPAP machine and supplies Enroll in [employer]  [facility] We will start you on settings of Auto Set 5-20 cm H2O pressure.  I will refer you to [facility] for weight loss In the meantime, watch your diet and carb intake. Increase activity as able. Follow up after CPAP Titration study to review results. Once you get your machine : Start  on CPAP at bedtime. Goal is to wear for at least 6 hours each night for maximal clinical benefit. Continue to work on weight loss, as the link between excess weight  and sleep apnea is well established.   Remember to establish a good bedtime routine, and work on sleep hygiene.  Limit daytime naps , avoid stimulants such as caffeine and nicotine close to bedtime, exercise daily to promote sleep quality, avoid heavy , spicy, fried , or rich foods before bed. Ensure adequate exposure to natural light during the day,establish a relaxing bedtime routine with a pleasant sleep environment ( Bedroom between 60 and 67 degrees, turn off bright lights , TV or device screens screens , consider black out curtains or white noise machines) Do not drive if sleepy. Remember to clean mask, tubing, filter, and reservoir once weekly with soapy water.  Follow up with Maralyn SagoSarah NP  In 4 weeks  or before as needed.    Please contact office for sooner follow up if symptoms do not improve or worsen or seek emergency care   Continue Losartan for BP, it is currently well controlled.   I spent 45 minutes dedicated to the care of this patient on the date of this encounter to include pre-visit review of records, face-to-face time with the patient discussing conditions above, post visit ordering of testing, clinical documentation with the electronic health record, making appropriate referrals as documented, and communicating necessary information to the patient's healthcare team.       Bevelyn Ngo, NP 08/15/2021  2:30 PM

## 2021-08-15 NOTE — Patient Instructions (Addendum)
It is good to see you today. We will place an order for a split night study.  You will get a call to get this scheduled.  This will help determine the correct settings to manage your severe sleep apnea and oxygen desaturations.  We will place order for CPAP machine and supplies Enroll in [employer] We will start you on settings of Auto Set 5-20 cm H2O pressure.  I will refer you to [facility] for weight loss In the meantime, watch your diet and carb intake. Increase activity as able. Follow up after CPAP Titration study to review results. Once you get your machine : Start  on CPAP at bedtime. Goal is to wear for at least 6 hours each night for maximal clinical benefit. Continue to work on weight loss, as the link between excess weight  and sleep apnea is well established.   Remember to establish a good bedtime routine, and work on sleep hygiene.  Limit daytime naps , avoid stimulants such as caffeine and nicotine close to bedtime, exercise daily to promote sleep quality, avoid heavy , spicy, fried , or rich foods before bed. Ensure adequate exposure to natural light during the day,establish a relaxing bedtime routine with a pleasant sleep environment ( Bedroom between 60 and 67 degrees, turn off bright lights , TV or device screens screens , consider black out curtains or white noise machines) Do not drive if sleepy. Remember to clean mask, tubing, filter, and reservoir once weekly with soapy water.  Follow up with Camiyah Friberg NP  In 4 weeks or before as needed.    Please contact office for sooner follow up if symptoms do not improve or worsen or seek emergency care

## 2021-08-19 DIAGNOSIS — M25561 Pain in right knee: Secondary | ICD-10-CM | POA: Diagnosis not present

## 2021-08-22 DIAGNOSIS — M25561 Pain in right knee: Secondary | ICD-10-CM | POA: Diagnosis not present

## 2021-08-26 DIAGNOSIS — M25561 Pain in right knee: Secondary | ICD-10-CM | POA: Diagnosis not present

## 2021-09-02 ENCOUNTER — Telehealth: Payer: Self-pay | Admitting: Acute Care

## 2021-09-05 NOTE — Telephone Encounter (Signed)
I called and spoke with Jerry Stein at [facility] and she is working on the order and [facility] is going to process the order and call him with this in the next couple of days.   I called the patient and let him know that he shoulder be getting a call to get the CPAP set up and he voices understating. Nothing further needed.

## 2021-09-22 ENCOUNTER — Encounter (INDEPENDENT_AMBULATORY_CARE_PROVIDER_SITE_OTHER): Payer: Self-pay | Admitting: Internal Medicine

## 2021-09-22 DIAGNOSIS — G4733 Obstructive sleep apnea (adult) (pediatric): Secondary | ICD-10-CM | POA: Diagnosis not present

## 2021-10-03 ENCOUNTER — Telehealth: Payer: Self-pay | Admitting: Acute Care

## 2021-10-03 DIAGNOSIS — G4730 Sleep apnea, unspecified: Secondary | ICD-10-CM

## 2021-10-03 NOTE — Telephone Encounter (Signed)
Order was placed on 08/15/21 for pt to have split night study.  Precert was received and I scheduled pt for 11/2. I called him to give appt info and he states he already had sleep study and just got his cpap machine a couple of weeks ago.  When I looked at SG's ov note it states she wants him to have a titration study.  I told pt he should still be able to go on 11/2 but looks like it needs to be a titration study.  Please advise.

## 2021-10-03 NOTE — Telephone Encounter (Signed)
Judson Roch- will you please clarify what type of sleep study this pt needs? Thanks!

## 2021-10-06 NOTE — Telephone Encounter (Signed)
Per [employer] message she states to do titration study as ordered but a split night is what was ordered.  Will need order for titration study please.

## 2021-10-06 NOTE — Addendum Note (Signed)
Addended by: Valerie Salts on: 10/06/2021 10:09 AM   Modules accepted: Orders

## 2021-10-06 NOTE — Telephone Encounter (Signed)
Cpap titration order placed

## 2021-10-22 DIAGNOSIS — G4733 Obstructive sleep apnea (adult) (pediatric): Secondary | ICD-10-CM | POA: Diagnosis not present

## 2021-11-06 ENCOUNTER — Ambulatory Visit (HOSPITAL_BASED_OUTPATIENT_CLINIC_OR_DEPARTMENT_OTHER): Payer: BC Managed Care – PPO | Attending: Acute Care | Admitting: Pulmonary Disease

## 2021-11-06 ENCOUNTER — Encounter (HOSPITAL_BASED_OUTPATIENT_CLINIC_OR_DEPARTMENT_OTHER): Payer: BC Managed Care – PPO | Admitting: Pulmonary Disease

## 2021-11-06 DIAGNOSIS — G4733 Obstructive sleep apnea (adult) (pediatric): Secondary | ICD-10-CM | POA: Insufficient documentation

## 2021-11-06 DIAGNOSIS — G4730 Sleep apnea, unspecified: Secondary | ICD-10-CM

## 2021-11-08 DIAGNOSIS — G4730 Sleep apnea, unspecified: Secondary | ICD-10-CM | POA: Diagnosis not present

## 2021-11-08 NOTE — Procedures (Signed)
      Patient Name: Jerry Stein, Jerry Stein Date: 11/06/2021 Gender: Male D.O.B: 1960-07-15 Age (years): 46 Referring Provider: Magdalen Spatz NP Height (inches): 75 Interpreting Physician: Chesley Mires MD, ABSM Weight (lbs): 312 RPSGT: Baxter Flattery BMI: 68 MRN: [id number] Neck Size: 18.00  CLINICAL INFORMATION The patient is referred for a BiPAP titration to treat sleep apnea.  Date of NPSG 06/16/21: AHI 75.1, SpO2 low 70%.  SLEEP STUDY TECHNIQUE As per the AASM Manual for the Scoring of Sleep and Associated Events v2.3 (April 2016) with a hypopnea requiring 4% desaturations.  The channels recorded and monitored were frontal, central and occipital EEG, electrooculogram (EOG), submentalis EMG (chin), nasal and oral airflow, thoracic and abdominal wall motion, anterior tibialis EMG, snore microphone, electrocardiogram, and pulse oximetry. Bilevel positive airway pressure (BPAP) was initiated at the beginning of the study and titrated to treat sleep-disordered breathing.  MEDICATIONS Medications self-administered by patient taken the night of the study : TOPIRAMATE  RESPIRATORY PARAMETERS Optimal IPAP Pressure (cm): 16 AHI at Optimal Pressure (/hr) 7.7 Optimal EPAP Pressure (cm): 12   Overall Minimal O2 (%): 78.0 Minimal O2 at Optimal Pressure (%): 91.0  He was tried on CPAP but continued to have frequent obstructive respiratory events.  This improved once he was transitioned to Bipap 16/12 cm H2O.  He had more fragmented sleep with higher Bipap settings.  SLEEP ARCHITECTURE Start Time: 11:10:06 PM Stop Time: 5:14:21 AM Total Time (min): 364.2 Total Sleep Time (min): 141.5 Sleep Latency (min): 92.6 Sleep Efficiency (%): 38.8% REM Latency (min): 183.5 WASO (min): 130.1 Stage N1 (%): 5.7% Stage N2 (%): 83.0% Stage N3 (%): 0.0% Stage R (%): 11.3 Supine (%): 100.00 Arousal Index (/hr): 3.4   CARDIAC DATA The 2 lead EKG demonstrated sinus rhythm. The mean heart rate was 72.0 beats  per minute. Other EKG findings include: None.  LEG MOVEMENT DATA The total Periodic Limb Movements of Sleep (PLMS) were 0. The PLMS index was 0.0. A PLMS index of <15 is considered normal in adults.  IMPRESSIONS - He had persistent obstructive respiratory events with CPAP. - He did well with Bipap 16/12 cm H2O. - He did not require supplemental oxygen during this study.  DIAGNOSIS - Obstructive Sleep Apnea (G47.33)  RECOMMENDATIONS - Trial of BiPAP therapy on 16/12 cm H2O with a Large size Resmed Full Face AirFit 20 for Her mask and heated humidification. - Avoid alcohol, sedatives and other CNS depressants that may worsen sleep apnea and disrupt normal sleep architecture. - Sleep hygiene should be reviewed to assess factors that may improve sleep quality. - Weight management and regular exercise should be initiated or continued.  [Electronically signed] 11/08/2021 06:15 PM  Chesley Mires MD, ABSM Diplomate, American Board of Sleep Medicine NPI: [id number]  [place] PH: [phone]   FX: [phone] [place]

## 2021-11-14 ENCOUNTER — Ambulatory Visit (INDEPENDENT_AMBULATORY_CARE_PROVIDER_SITE_OTHER): Payer: BC Managed Care – PPO | Admitting: Acute Care

## 2021-11-14 ENCOUNTER — Encounter: Payer: Self-pay | Admitting: Acute Care

## 2021-11-14 VITALS — BP 120/90 | HR 81 | Temp 98.0°F | Ht 78.0 in | Wt 314.8 lb

## 2021-11-14 DIAGNOSIS — E669 Obesity, unspecified: Secondary | ICD-10-CM

## 2021-11-14 DIAGNOSIS — G4733 Obstructive sleep apnea (adult) (pediatric): Secondary | ICD-10-CM

## 2021-11-14 MED ORDER — CETIRIZINE HCL 10 MG PO TABS: 10.0000 mg | ORAL_TABLET | Freq: Every day | ORAL | 1 refills | Status: DC

## 2021-11-14 NOTE — Progress Notes (Signed)
History of Present Illness Jerry Stein is a 61 y.o. male former smoker with severe OSA, Primary HTN, depression, Anxiety, Hyperlipidemia,Obesity, Pre-diabetes, and  Fatigue . He was initially started on CPAP therapy, but had frequent obstructive respiratory events , which improved once transitioned to BiPAP.  He is followed by Dr. Craige Cotta.   11/14/2021 Pt. Presents for follow up after BiPAP titration study to treat sleep apnea. Pt.  was started  on CPAP but continued to have frequent obstructive respiratory events.  This improved once he was transitioned to Bipap 16/12 cm H2O. He had more fragmented sleep with higher Bipap settings.  Dr. Craige Cotta read the study and recommends Trial of BiPAP therapy on 16/12 cm H2O with a Large size Resmed Full Face AirFit 20 for Her mask and heated humidification. Pt. Is in agreement with this plan.  Pt was referred to healthy weight and wellness 08/2021. He was scheduled to be seen and he missed the appointment due to a death in the family. I will re-refer. I have encouraged increased exercise and diet.    Test Results: Date of NPSG 06/16/21: AHI 75.1, SpO2 low 70%.  Test Results: Neck Size 17.25 inches Severe obstructive sleep apnea occurred during this study (AHI = 75.1/h). Severe oxygen desaturation was noted during this study (Min O2 = 70.0%). Mean 89.8%, indicating nocturnal hypoxemia. EKG findings include PVCs Severe Sleep Apnea with AHI of 75.1 per hour Impressions   IMPRESSIONS - Severe obstructive sleep apnea occurred during this study (AHI = 75.1/h). - Insufficient early sleep and events to meet protocol requirements for split protocol CPAP titration. - Severe oxygen desaturation was noted during this study (Min O2 = 70.0%). Mean 89.8%, indicating nocturnal hypoxemia. - The patient snored with moderate snoring volume. - EKG findings include PVCs. - Clinically significant periodic limb movements did not occur during sleep. No significant associated  arousals.   Sleep questionnaire>> 08/15/2021 Symptoms-  snoring, daytime sleepiness, headaches, HTN, fatigue Prior sleep study-  Yes Bedtime- After midnight Time to fall asleep- Not answered Nocturnal awakenings- 2-3 times per night Out of bed/start of day- Varies Weight changes- 65 pound weight gain over the last 1-1.5 years Do you operate heavy machinery- No Do you currently wear CPAP- No Do you current wear oxygen- No Epworth-  15     08/15/2021   11:27 AM  Results of the Epworth flowsheet  Sitting and reading 3  Watching TV 2  Sitting, inactive in a public place (e.g. a theatre or a meeting) 1  As a passenger in a car for an hour without a break 1  Lying down to rest in the afternoon when circumstances permit 2  Sitting and talking to someone 1  Sitting quietly after a lunch without alcohol 2  In a car, while stopped for a few minutes in traffic 3  Total score 15          Latest Ref Rng & Units 07/15/2021   10:00 AM 02/14/2021    3:22 PM 03/10/2019    8:59 AM  CBC  WBC 4.0 - 10.5 K/uL 5.1  4.9  8.2   Hemoglobin 13.0 - 17.0 g/dL 48.5  46.2  70.3   Hematocrit 39.0 - 52.0 % 45.3  42.6  42.7   Platelets 150 - 400 K/uL 188  193  209        Latest Ref Rng & Units 07/15/2021   10:00 AM 06/04/2021   11:29 AM 03/21/2021    4:25 PM  BMP  Glucose 70 - 99 mg/dL 99  680  91   BUN 8 - 23 mg/dL 19  21  20    Creatinine 0.61 - 1.24 mg/dL  3.21  2.24   BUN/Creat Ratio 10 - 24  18  16    Sodium 135 - 145 mmol/L 145  139  142   Potassium 3.5 - 5.1 mmol/L 4.4  4.1  5.3   Chloride 98 - 111 mmol/L 116  107  105   CO2 22 - 32 mmol/L 24  19  26    Calcium 8.9 - 10.3 mg/dL 9.6  9.0  9.6     BNP No results found for: "BNP"  ProBNP No results found for: "PROBNP"  PFT No results found for: "FEV1PRE", "FEV1POST", "FVCPRE", "FVCPOST", "TLC", "DLCOUNC", "PREFEV1FVCRT", "PSTFEV1FVCRT"  SLEEP STUDY DOCUMENTS  Result Date: 11/11/2021 Ordered by an unspecified provider.    Past  medical hx Past Medical History:  Diagnosis Date   Allergy    HEYFEVER,POLLEN   Anxiety    Arthritis    Cataract    MILD,BILATERAL   Clotting disorder (HCC)    CLOT RETINA,LEFT 47 YEARS AGO,DUE TO EYE INJURY   Depression    Headache    migraine   High blood pressure 05/25/2017   Hyperlipidemia 06/09/2017   Pneumonia    Pre-diabetes    Sleep apnea    no cpap   Substance abuse (HCC) 1979   once      Social History   Tobacco Use   Smoking status: Former    Packs/day: 1.00    Years: 21.00    Total pack years: 21.00    Types: Cigarettes    Start date: 89    Quit date: 12/19/2017    Years since quitting: 3.9    Passive exposure: Current (WIFE JUST QUIT UPDATE 03/20/21)   Smokeless tobacco: Never   Tobacco comments:    Quit 2019  Vaping Use   Vaping Use: Never used  Substance Use Topics   Alcohol use: Yes    Comment: every other month   Drug use: Not Currently    Mr.Gilreath reports that he quit smoking about 3 years ago. His smoking use included cigarettes. He started smoking about 25 years ago. He has a 21.00 pack-year smoking history. He has been exposed to tobacco smoke. He has never used smokeless tobacco. He reports current alcohol use. He reports that he does not currently use drugs.  Tobacco Cessation: Former smoker with a 21 pack year smoking history, Quit 1998  Past surgical hx, Family hx, Social hx all reviewed.  Current Outpatient Medications on File Prior to Visit  Medication Sig   buPROPion (WELLBUTRIN XL) 150 MG 24 hr tablet Take 1 tablet (150 mg total) by mouth daily.   cetirizine (ZYRTEC) 10 MG tablet Take 1 tablet (10 mg total) by mouth daily. (Patient taking differently: Take 10 mg by mouth daily as needed for allergies.)   fluticasone (FLONASE) 50 MCG/ACT nasal spray Place 2 sprays into both nostrils at bedtime. (Patient taking differently: Place 2 sprays into both nostrils every other day.)   HYDROcodone-acetaminophen (NORCO) 7.5-325 MG tablet  Take 1 tablet by mouth every 6 (six) hours as needed for moderate pain.   losartan (COZAAR) 50 MG tablet Take 1 tablet (50 mg total) by mouth daily.   naproxen sodium (ALEVE) 220 MG tablet Take 220 mg by mouth daily as needed (pain).   ondansetron (ZOFRAN) 4 MG tablet Take 1 tablet (4 mg total) by  mouth every 8 (eight) hours as needed for nausea or vomiting.   rosuvastatin (CRESTOR) 20 MG tablet TAKE ONE TABLET BY MOUTH EVERY DAY   topiramate (TOPAMAX) 50 MG tablet Take 50 mg by mouth at bedtime.   traMADol (ULTRAM) 50 MG tablet Take 50 mg by mouth every 6 (six) hours as needed for pain.   Current Facility-Administered Medications on File Prior to Visit  Medication   0.9 %  sodium chloride infusion     Allergies  Allergen Reactions   Lisinopril Cough   Shrimp [Shellfish Allergy] Swelling    Review Of Systems:  Constitutional:   No  weight loss, night sweats,  Fevers, chills, +fatigue, or  lassitude.  HEENT:   No headaches,  Difficulty swallowing,  Tooth/dental problems, or  Sore throat,                No sneezing, itching, ear ache, nasal congestion, post nasal drip,   CV:  No chest pain,  Orthopnea, PND, swelling in lower extremities, anasarca, dizziness, palpitations, syncope.   GI  No heartburn, indigestion, abdominal pain, nausea, vomiting, diarrhea, change in bowel habits, loss of appetite, bloody stools.   Resp: No shortness of breath with exertion or at rest.  No excess mucus, no productive cough,  No non-productive cough,  No coughing up of blood.  No change in color of mucus.  No wheezing.  No chest wall deformity  Skin: no rash or lesions.  GU: no dysuria, change in color of urine, no urgency or frequency.  No flank pain, no hematuria   MS:  No joint pain or swelling.  No decreased range of motion.  No back pain.  Psych:  No change in mood or affect. No depression or anxiety.  No memory loss.   Vital Signs BP (!) 120/90 (BP Location: Left Arm, Patient Position:  Sitting, Cuff Size: Large)   Pulse 81   Temp 98 F (36.7 C) (Oral)   Ht 6\' 6"  (1.981 m)   Wt (!) 314 lb 12.8 oz (142.8 kg)   SpO2 97%   BMI 36.38 kg/m    Physical Exam:  General- No distress,  A&Ox3, pleasant ENT: No sinus tenderness, TM clear, pale nasal mucosa, no oral exudate,no post nasal drip, no LAN Cardiac: S1, S2, regular rate and rhythm, no murmur Chest: No wheeze/ rales/ dullness; no accessory muscle use, no nasal flaring, no sternal retractions Abd.: Soft Non-tender, ND, BS +, Body mass index is 36.38 kg/m.  Ext: No clubbing cyanosis, edema Neuro:  normal strength, MAE x 4, A&O x 3 Skin: No rashes, warm and dry, no lesions  Psych: normal mood and behavior   Assessment/Plan Severe OSA, uncontrolled by CPAP BiPAP titration study shows good  control with BiPAP Plan We will order a BiPAP machine, all equipment and supplies.  Settings are 16/12 cm H2O Large size Resmed Full Face AirFit 20 for Her mask and heated humidification  Pt. Uses [employer]. Your  current CPAP will need to be returned and changed to a BiPAP machine. Continue on BiPAP at bedtime. You appear to be benefiting from the treatment  Goal is to wear for at least 4 hours each night for maximal clinical benefit. Continue to work on weight loss, as the link between excess weight  and sleep apnea is well established.   Remember to establish a good bedtime routine, and work on sleep hygiene.  Limit daytime naps , avoid stimulants such as caffeine and nicotine close to bedtime, exercise  daily to promote sleep quality, avoid heavy , spicy, fried , or rich foods before bed. Ensure adequate exposure to natural light during the day,establish a relaxing bedtime routine with a pleasant sleep environment ( Bedroom between 60 and 67 degrees, turn off bright lights , TV or device screens screens , consider black out curtains or white noise machines) Do not drive if sleepy. We will re-refer to healthy weight and wellness.   Remember to clean mask, tubing, filter, and reservoir once weekly with soapy water.  Follow up with  Dr.Sood or Boluwatife Mutchler NP in  2 months , with down Load on BiPAP settings to ensure settings are controlling OSA.   Call us if you need [place] sooner.  Please contact office for sooner follow up if symptoms do not improve or worsen or seek emergency care       I spent 40 minutes dedicated to the care of this patient on the date of this encounter to include pre-visit review of records, face-to-face time with the patient discussing conditions above, post visit ordering of testing, clinical documentation with the electronic health record, making appropriate referrals as documented, and communicating necessary information to the patient's healthcare team.   Bevelyn Ngo, NP 11/14/2021  11:20 AM

## 2021-11-14 NOTE — Patient Instructions (Addendum)
It is good to see you today. We will order a BiPAP machine, all equipment and supplies.  Settings are 16/12 cm H2O Large size Resmed Full Face AirFit 20 for Her mask and heated humidification  Pt. Uses [employer]. Your  current CPAP will need to be returned and changed to a BiPAP machine. Continue on BiPAP at bedtime. You appear to be benefiting from the treatment  Goal is to wear for at least 4 hours each night for maximal clinical benefit. Continue to work on weight loss, as the link between excess weight  and sleep apnea is well established.   Remember to establish a good bedtime routine, and work on sleep hygiene.  Limit daytime naps , avoid stimulants such as caffeine and nicotine close to bedtime, exercise daily to promote sleep quality, avoid heavy , spicy, fried , or rich foods before bed. Ensure adequate exposure to natural light during the day,establish a relaxing bedtime routine with a pleasant sleep environment ( Bedroom between 60 and 67 degrees, turn off bright lights , TV or device screens screens , consider black out curtains or white noise machines) Do not drive if sleepy. We will re-refer to healthy weight and wellness.  Remember to clean mask, tubing, filter, and reservoir once weekly with soapy water.  Follow up with  Dr.Sood or Sabella Traore NP in  2 months , with down Load on BiPAP settings to ensure settings are controlling OSA.   Call us if you need [place] sooner.  Please contact office for sooner follow up if symptoms do not improve or worsen or seek emergency care

## 2021-11-17 NOTE — Progress Notes (Signed)
Reviewed and agree with assessment/plan.   Anirudh Baiz, MD  Pulmonary/Critical Care 11/17/2021, 9:34 AM Pager:  [phone]  

## 2021-11-22 DIAGNOSIS — G4733 Obstructive sleep apnea (adult) (pediatric): Secondary | ICD-10-CM | POA: Diagnosis not present

## 2021-12-22 ENCOUNTER — Emergency Department (HOSPITAL_BASED_OUTPATIENT_CLINIC_OR_DEPARTMENT_OTHER): Payer: BC Managed Care – PPO | Admitting: Radiology

## 2021-12-22 ENCOUNTER — Other Ambulatory Visit: Payer: Self-pay

## 2021-12-22 DIAGNOSIS — Z79899 Other long term (current) drug therapy: Secondary | ICD-10-CM | POA: Insufficient documentation

## 2021-12-22 DIAGNOSIS — S20302A Unspecified superficial injuries of left front wall of thorax, initial encounter: Secondary | ICD-10-CM | POA: Insufficient documentation

## 2021-12-22 DIAGNOSIS — G4733 Obstructive sleep apnea (adult) (pediatric): Secondary | ICD-10-CM | POA: Diagnosis not present

## 2021-12-22 DIAGNOSIS — Z0389 Encounter for observation for other suspected diseases and conditions ruled out: Secondary | ICD-10-CM | POA: Diagnosis not present

## 2021-12-22 DIAGNOSIS — X58XXXA Exposure to other specified factors, initial encounter: Secondary | ICD-10-CM | POA: Diagnosis not present

## 2021-12-22 DIAGNOSIS — R0781 Pleurodynia: Secondary | ICD-10-CM | POA: Diagnosis not present

## 2021-12-22 NOTE — ED Triage Notes (Signed)
Pt states that he sneezed really hard 1 month ago and has had left rib pain since. Pain worsens with certain movement.

## 2021-12-23 ENCOUNTER — Emergency Department (HOSPITAL_BASED_OUTPATIENT_CLINIC_OR_DEPARTMENT_OTHER)
Admission: EM | Admit: 2021-12-23 | Discharge: 2021-12-23 | Disposition: A | Discharge status: Home / Self Care | Payer: BC Managed Care – PPO | Attending: Emergency Medicine | Admitting: Emergency Medicine

## 2021-12-23 DIAGNOSIS — R0781 Pleurodynia: Secondary | ICD-10-CM

## 2021-12-23 MED ORDER — IBUPROFEN 400 MG PO TABS
600.0000 mg | ORAL_TABLET | Freq: Once | ORAL | Status: AC
  Administered 2021-12-23: 600 mg via ORAL
  Filled 2021-12-23: qty 1

## 2021-12-23 MED ORDER — NAPROXEN 500 MG PO TABS: 500.0000 mg | ORAL_TABLET | Freq: Two times a day (BID) | ORAL | 0 refills | Status: DC

## 2021-12-23 NOTE — ED Provider Notes (Signed)
[facility] EMERGENCY DEPT Provider Note   CSN: [id number] Arrival date & time: 12/22/21  1647     History  Chief Complaint  Patient presents with   Rib Injury    Jerry Stein is a 61 y.o. male.  HPI     This is a 61 year old male who presents with left rib pain.  Patient reports approximately 3 weeks ago he was putting on his BiPAP when "I held in a sneeze."  He states he had immediate pain in the left rib area.  He has had pain since.  He gets some relief with naproxen and Tylenol at home.  He has not had any shortness of breath.  Pain is worse when he moves or coughs.  He has not had any productivity or cough or fever.  Home Medications Prior to Admission medications   Medication Sig Start Date End Date Taking? Authorizing Provider  naproxen (NAPROSYN) 500 MG tablet Take 1 tablet (500 mg total) by mouth 2 (two) times daily. 12/23/21  Yes Orren Pietsch, Mayer Masker, MD  buPROPion (WELLBUTRIN XL) 150 MG 24 hr tablet Take 1 tablet (150 mg total) by mouth daily. 07/15/21   [name], DO  cetirizine (ZYRTEC) 10 MG tablet Take 1 tablet (10 mg total) by mouth daily. 11/14/21   Bevelyn Ngo, NP  fluticasone (FLONASE) 50 MCG/ACT nasal spray Place 2 sprays into both nostrils at bedtime. Patient taking differently: Place 2 sprays into both nostrils every other day. 02/14/21   [name], DO  losartan (COZAAR) 50 MG tablet Take 1 tablet (50 mg total) by mouth daily. 07/27/21   [name], DO  naproxen sodium (ALEVE) 220 MG tablet Take 220 mg by mouth daily as needed (pain).    [provider]  ondansetron (ZOFRAN) 4 MG tablet Take 1 tablet (4 mg total) by mouth every 8 (eight) hours as needed for nausea or vomiting. 07/18/21   Yolonda Kida, MD  rosuvastatin (CRESTOR) 20 MG tablet TAKE ONE TABLET BY MOUTH EVERY DAY 07/21/21   [name], DO  topiramate (TOPAMAX) 50 MG tablet Take 50 mg by mouth at bedtime.    [provider]  traMADol (ULTRAM)  50 MG tablet Take 50 mg by mouth every 6 (six) hours as needed for pain. 05/27/21   [provider]      Allergies    Lisinopril and Shrimp [shellfish allergy]    Review of Systems   Review of Systems  Cardiovascular:  Positive for chest pain.  All other systems reviewed and are negative.   Physical Exam Updated Vital Signs BP (!) 147/107   Pulse 66   Temp 98.7 F (37.1 C) (Oral)   Resp 18   Ht 1.981 m (6\' 6" )   Wt 131.5 kg   SpO2 99%   BMI 33.51 kg/m  Physical Exam Vitals and nursing note reviewed.  Constitutional:      Appearance: He is well-developed. He is obese. He is not ill-appearing.  HENT:     Head: Normocephalic and atraumatic.     Mouth/Throat:     Mouth: Mucous membranes are moist.  Eyes:     Pupils: Pupils are equal, round, and reactive to light.  Cardiovascular:     Rate and Rhythm: Normal rate and regular rhythm.     Heart sounds: Normal heart sounds. No murmur heard. Pulmonary:     Effort: Pulmonary effort is normal. No respiratory distress.     Breath sounds: Normal breath sounds. No wheezing.  Comments: Tenderness palpation left chest wall, no overlying skin changes or crepitus Chest:     Chest wall: Tenderness present.  Abdominal:     Palpations: Abdomen is soft.     Tenderness: There is no abdominal tenderness.  Musculoskeletal:     Cervical back: Neck supple.     Right lower leg: No edema.     Left lower leg: No edema.  Lymphadenopathy:     Cervical: No cervical adenopathy.  Skin:    General: Skin is warm and dry.  Neurological:     Mental Status: He is alert and oriented to person, place, and time.  Psychiatric:        Mood and Affect: Mood normal.     ED Results / Procedures / Treatments   Labs (all labs ordered are listed, but only abnormal results are displayed) Labs Reviewed - No data to display  EKG EKG Interpretation  Date/Time:  Monday December 22 2021 19:08:15 EST Ventricular Rate:  71 PR  Interval:  180 QRS Duration: 86 QT Interval:  374 QTC Calculation: 406 R Axis:   29 Text Interpretation: Normal sinus rhythm Inferior T wave changes Abnormal ECG When compared with ECG of 15-Jul-2021 10:49, PR interval has decreased Confirmed by Ross Marcus ([id number]) on 12/23/2021 1:02:31 AM  Radiology DG Ribs Unilateral W/Chest Left  Result Date: 12/22/2021 CLINICAL DATA:  Left rib injury EXAM: LEFT RIBS AND CHEST - 3+ VIEW COMPARISON:  Chest radiograph dated 03/10/2019 FINDINGS: Lungs are clear.  No pleural effusion or pneumothorax. The heart is top-normal in size. No displaced left rib fracture is seen. IMPRESSION: Negative. Electronically Signed   By: Charline Bills M.D.   On: 12/22/2021 19:56    Procedures Procedures    Medications Ordered in ED Medications - No data to display  ED Course/ Medical Decision Making/ A&P                           Medical Decision Making Amount and/or Complexity of Data Reviewed Radiology: ordered.  Risk Prescription drug management.   This patient presents to the ED for concern of left chest wall pain, this involves an extensive number of treatment options, and is a complaint that carries with it a high risk of complications and morbidity.  I considered the following differential and admission for this acute, potentially life threatening condition.  The differential diagnosis includes musculoskeletal injury such as rib fracture, strain, pneumonia, pneumothorax, less likely ACS MDM:    This is a 61 year old male who presents with left rib pain.  This has been ongoing for the last 3 weeks.  It is worse with movement and coughing.  He has reproducible pain on exam.  EKG shows no evidence of acute ischemia.  Chest x-ray does not show an obvious rib fracture or pneumothorax.  Patient had significant improvement of symptoms with anti-inflammatories.  Highly suspect musculoskeletal etiology.  We discussed ongoing symptom control including  anti-inflammatories for limited course.  No known history of renal disease.    (Labs, imaging, consults)  Labs: I Ordered, and personally interpreted labs.  The pertinent results include: None  Imaging Studies ordered: I ordered imaging studies including chest x-ray I independently visualized and interpreted imaging. I agree with the radiologist interpretation  Additional history obtained from wife at bedside.  External records from outside source obtained and reviewed including prior evaluations  Cardiac Monitoring: The patient was maintained on a cardiac monitor.  I personally viewed and  interpreted the cardiac monitored which showed an underlying rhythm of: Sinus rhythm  Reevaluation: After the interventions noted above, I reevaluated the patient and found that they have :improved  Social Determinants of Health:  lives independently  Disposition: Discharge  Co morbidities that complicate the patient evaluation  Past Medical History:  Diagnosis Date   Allergy    HEYFEVER,POLLEN   Anxiety    Arthritis    Cataract    MILD,BILATERAL   Clotting disorder (HCC)    CLOT RETINA,LEFT 47 YEARS AGO,DUE TO EYE INJURY   Depression    Headache    migraine   High blood pressure 05/25/2017   Hyperlipidemia 06/09/2017   Pneumonia    Pre-diabetes    Sleep apnea    no cpap   Substance abuse (HCC) 1979   once      Medicines Meds ordered this encounter  Medications   naproxen (NAPROSYN) 500 MG tablet    Sig: Take 1 tablet (500 mg total) by mouth 2 (two) times daily.    Dispense:  30 tablet    Refill:  0    Limit use to 5 days    I have reviewed the patients home medicines and have made adjustments as needed  Problem List / ED Course: Problem List Items Addressed This Visit   None Visit Diagnoses     Rib pain on left side    -  Primary                   Final Clinical Impression(s) / ED Diagnoses Final diagnoses:  Rib pain on left side    Rx / DC  Orders ED Discharge Orders          Ordered    naproxen (NAPROSYN) 500 MG tablet  2 times daily       Note to Pharmacy: Limit use to 5 days   12/23/21 0121              Shon Baton, MD 12/23/21 [phone]

## 2021-12-23 NOTE — Discharge Instructions (Signed)
You were seen today for left chest wall pain.  This is likely related to a muscle strain.  Take naproxen twice daily.  Limit use to 5 days.

## 2022-01-07 ENCOUNTER — Other Ambulatory Visit: Payer: Self-pay | Admitting: Student

## 2022-01-22 ENCOUNTER — Other Ambulatory Visit: Payer: Self-pay | Admitting: Student

## 2022-01-22 DIAGNOSIS — R4589 Other symptoms and signs involving emotional state: Secondary | ICD-10-CM

## 2022-01-22 DIAGNOSIS — R5383 Other fatigue: Secondary | ICD-10-CM

## 2022-01-22 DIAGNOSIS — G4733 Obstructive sleep apnea (adult) (pediatric): Secondary | ICD-10-CM | POA: Diagnosis not present

## 2022-02-22 DIAGNOSIS — G4733 Obstructive sleep apnea (adult) (pediatric): Secondary | ICD-10-CM | POA: Diagnosis not present

## 2022-03-23 DIAGNOSIS — G4733 Obstructive sleep apnea (adult) (pediatric): Secondary | ICD-10-CM | POA: Diagnosis not present

## 2022-04-23 DIAGNOSIS — G4733 Obstructive sleep apnea (adult) (pediatric): Secondary | ICD-10-CM | POA: Diagnosis not present

## 2022-05-23 DIAGNOSIS — G4733 Obstructive sleep apnea (adult) (pediatric): Secondary | ICD-10-CM | POA: Diagnosis not present

## 2022-06-02 DIAGNOSIS — I10 Essential (primary) hypertension: Secondary | ICD-10-CM | POA: Diagnosis not present

## 2022-06-02 DIAGNOSIS — Z811 Family history of alcohol abuse and dependence: Secondary | ICD-10-CM | POA: Diagnosis not present

## 2022-06-02 DIAGNOSIS — Z791 Long term (current) use of non-steroidal anti-inflammatories (NSAID): Secondary | ICD-10-CM | POA: Diagnosis not present

## 2022-06-02 DIAGNOSIS — G4730 Sleep apnea, unspecified: Secondary | ICD-10-CM | POA: Diagnosis not present

## 2022-06-02 DIAGNOSIS — Z8249 Family history of ischemic heart disease and other diseases of the circulatory system: Secondary | ICD-10-CM | POA: Diagnosis not present

## 2022-06-02 DIAGNOSIS — M1990 Unspecified osteoarthritis, unspecified site: Secondary | ICD-10-CM | POA: Diagnosis not present

## 2022-06-02 DIAGNOSIS — Z833 Family history of diabetes mellitus: Secondary | ICD-10-CM | POA: Diagnosis not present

## 2022-06-02 DIAGNOSIS — Z87891 Personal history of nicotine dependence: Secondary | ICD-10-CM | POA: Diagnosis not present

## 2022-06-02 DIAGNOSIS — Z6839 Body mass index (BMI) 39.0-39.9, adult: Secondary | ICD-10-CM | POA: Diagnosis not present

## 2022-06-02 DIAGNOSIS — K219 Gastro-esophageal reflux disease without esophagitis: Secondary | ICD-10-CM | POA: Diagnosis not present

## 2022-06-02 DIAGNOSIS — Z803 Family history of malignant neoplasm of breast: Secondary | ICD-10-CM | POA: Diagnosis not present

## 2022-06-02 DIAGNOSIS — E785 Hyperlipidemia, unspecified: Secondary | ICD-10-CM | POA: Diagnosis not present

## 2022-06-23 DIAGNOSIS — G4733 Obstructive sleep apnea (adult) (pediatric): Secondary | ICD-10-CM | POA: Diagnosis not present

## 2022-07-23 DIAGNOSIS — G4733 Obstructive sleep apnea (adult) (pediatric): Secondary | ICD-10-CM | POA: Diagnosis not present

## 2022-07-29 ENCOUNTER — Other Ambulatory Visit: Payer: Self-pay | Admitting: Student

## 2022-07-29 DIAGNOSIS — I10 Essential (primary) hypertension: Secondary | ICD-10-CM

## 2022-08-18 ENCOUNTER — Other Ambulatory Visit: Payer: Self-pay | Admitting: Student

## 2022-08-18 DIAGNOSIS — I10 Essential (primary) hypertension: Secondary | ICD-10-CM

## 2022-08-25 DIAGNOSIS — G4733 Obstructive sleep apnea (adult) (pediatric): Secondary | ICD-10-CM | POA: Diagnosis not present

## 2022-08-27 DIAGNOSIS — G4733 Obstructive sleep apnea (adult) (pediatric): Secondary | ICD-10-CM | POA: Diagnosis not present

## 2022-09-26 DIAGNOSIS — G4733 Obstructive sleep apnea (adult) (pediatric): Secondary | ICD-10-CM | POA: Diagnosis not present

## 2022-10-20 ENCOUNTER — Other Ambulatory Visit: Payer: Self-pay | Admitting: Student

## 2022-10-20 DIAGNOSIS — I10 Essential (primary) hypertension: Secondary | ICD-10-CM

## 2022-10-27 DIAGNOSIS — G4733 Obstructive sleep apnea (adult) (pediatric): Secondary | ICD-10-CM | POA: Diagnosis not present

## 2022-11-06 ENCOUNTER — Ambulatory Visit (INDEPENDENT_AMBULATORY_CARE_PROVIDER_SITE_OTHER): Payer: Self-pay | Admitting: Student

## 2022-11-06 ENCOUNTER — Encounter: Payer: Self-pay | Admitting: Student

## 2022-11-06 VITALS — BP 146/90 | HR 81 | Temp 98.1°F | Ht 78.0 in | Wt 332.4 lb

## 2022-11-06 DIAGNOSIS — E785 Hyperlipidemia, unspecified: Secondary | ICD-10-CM

## 2022-11-06 DIAGNOSIS — I10 Essential (primary) hypertension: Secondary | ICD-10-CM

## 2022-11-06 DIAGNOSIS — Z122 Encounter for screening for malignant neoplasm of respiratory organs: Secondary | ICD-10-CM

## 2022-11-06 DIAGNOSIS — R7303 Prediabetes: Secondary | ICD-10-CM

## 2022-11-06 LAB — POCT GLYCOSYLATED HEMOGLOBIN (HGB A1C): HbA1c, POC (prediabetic range): 5.9 % (ref 5.7–6.4)

## 2022-11-06 MED ORDER — LOSARTAN POTASSIUM-HCTZ 50-12.5 MG PO TABS: 1.0000 | ORAL_TABLET | Freq: Every day | ORAL | 0 refills | Status: DC

## 2022-11-06 NOTE — Progress Notes (Addendum)
  SUBJECTIVE:   CHIEF COMPLAINT / HPI:   Hypertension: BP: (!) 146/90 today. Home medications include: Losartan 50 mg daily. He endorses taking these medications as prescribed. Patient has not had a BMP in the past 1 year.  PERTINENT  PMH / PSH: OSA, HTN, HLD, prediabetes, depression/GAD  OBJECTIVE:  BP (!) 146/90   Pulse 81   Temp 98.1 F (36.7 C)   Ht 6\' 6"  (1.981 m)   Wt (!) 332 lb 6.4 oz (150.8 kg)   SpO2 96%   BMI 38.41 kg/m  General: Well-appearing, NAD CV: RRR, no murmurs auscultated Pulm: CTAB, normal WOB  ASSESSMENT/PLAN:   Assessment & Plan Hyperlipidemia, unspecified hyperlipidemia type Compliant with rosuvastatin 20 mg daily.  Recheck lipid panel today. Primary hypertension BP: (!) 146/90 today. Poorly controlled. Goal of <130/80. Follow up in 2 weeks for BMP and RN BP check after starting medication in 1 week. Medication regimen: Transition to losartan-HCTZ 50-12.5 mg daily Prediabetes Check A1c.  Recommend returning for nutrition visit.  May consider Wegovy but not until patient has insurance. Screening for lung cancer Low-dose CT chest.  Quit smoking in 2019, 22-pack-year history.  Likely will not receive until he gets insurance.  [name], DO 11/06/2022, 2:39 PM PGY-3, [facility]

## 2022-11-06 NOTE — Assessment & Plan Note (Signed)
BP: (!) 146/90 today. Poorly controlled. Goal of <130/80. Follow up in 2 weeks for BMP and RN BP check after starting medication in 1 week. Medication regimen: Transition to losartan-HCTZ 50-12.5 mg daily

## 2022-11-06 NOTE — Assessment & Plan Note (Addendum)
Check A1c.  Recommend returning for nutrition visit.  May consider Wegovy but not until patient has insurance.

## 2022-11-06 NOTE — Assessment & Plan Note (Signed)
Compliant with rosuvastatin 20 mg daily.  Recheck lipid panel today.

## 2022-11-06 NOTE — Patient Instructions (Addendum)
It was great to see you today! Thank you for choosing [facility] for your primary care.  Today we addressed: Your blood pressure is elevated, you may continue taking your losartan until you pick up the new medication then please stop your losartan.  This new medication is losartan-HCTZ.  I would like you to be seen in the clinic for a blood pressure check with the nurse and a lab visit 1 week after starting this new medication.  I would recommend starting your medication on 11/8 and coming in for these checks on 11/15.  Please see me sometime in December for follow-up. We are also rechecking your cholesterol and your A1c.  And I have submitted order for lung cancer screening. Once you get your insurance, please come back to discuss weight loss and Tdap vaccine.  If you haven't already, sign up for My Chart to have easy access to your labs results, and communication with your primary care physician. We are checking some labs today. If they are abnormal, I will call you. If they are normal, I will send you a MyChart message (if it is active) or a letter in the mail. If you do not hear about your labs in the next 2 weeks, please call the office. I recommend that you always bring your medications to each appointment as this makes it easy to ensure you are on the correct medications and helps [place] not miss refills when you need them. Return in about 2 weeks (around 11/20/2022). Please arrive 15 minutes before your appointment to ensure smooth check in process.  We appreciate your efforts in making this happen.  Thank you for allowing me to participate in your care, [name], DO 11/06/2022, 2:18 PM PGY-3, [facility]

## 2022-11-07 LAB — BASIC METABOLIC PANEL
BUN/Creatinine Ratio: 15 (ref 10–24)
BUN: 18 mg/dL (ref 8–27)
CO2: 25 mmol/L (ref 20–29)
Calcium: 9.9 mg/dL (ref 8.6–10.2)
Chloride: 104 mmol/L (ref 96–106)
Creatinine, Ser: 1.2 mg/dL (ref 0.76–1.27)
Glucose: 87 mg/dL (ref 70–99)
Potassium: 4.8 mmol/L (ref 3.5–5.2)
Sodium: 143 mmol/L (ref 134–144)
eGFR: 68 mL/min/{1.73_m2} (ref >59)

## 2022-11-07 LAB — LIPID PANEL
Chol/HDL Ratio: 3.3 ratio (ref 0.0–5.0)
Cholesterol, Total: 151 mg/dL (ref 100–199)
HDL: 46 mg/dL (ref >39)
LDL Chol Calc (NIH): 89 mg/dL (ref 0–99)
Triglycerides: 81 mg/dL (ref 0–149)
VLDL Cholesterol Cal: 16 mg/dL (ref 5–40)

## 2022-11-26 DIAGNOSIS — G4733 Obstructive sleep apnea (adult) (pediatric): Secondary | ICD-10-CM | POA: Diagnosis not present

## 2022-12-31 DIAGNOSIS — G4733 Obstructive sleep apnea (adult) (pediatric): Secondary | ICD-10-CM | POA: Diagnosis not present

## 2023-01-14 ENCOUNTER — Other Ambulatory Visit: Payer: Self-pay | Admitting: Student

## 2023-01-20 ENCOUNTER — Other Ambulatory Visit: Payer: Self-pay | Admitting: Student

## 2023-02-12 ENCOUNTER — Other Ambulatory Visit: Payer: Self-pay

## 2023-02-12 DIAGNOSIS — I10 Essential (primary) hypertension: Secondary | ICD-10-CM

## 2023-02-12 MED ORDER — LOSARTAN POTASSIUM-HCTZ 50-12.5 MG PO TABS: 1.0000 | ORAL_TABLET | Freq: Every day | ORAL | 0 refills | Status: AC

## 2023-06-15 ENCOUNTER — Encounter: Payer: Self-pay | Admitting: *Deleted

## 2023-07-06 ENCOUNTER — Ambulatory Visit: Payer: Self-pay | Admitting: Family Medicine

## 2023-07-19 NOTE — Progress Notes (Deleted)
    SUBJECTIVE:   CHIEF COMPLAINT / HPI:   HTN - Currently on Losartan -HCTZ 50-12.5 mg daily - BP poorly controlled at last visit - No follow up since ***  Prediabetes/Weight - A1c 5,9, 8 months ago - Weight 332 pounds 11/2022  PERTINENT  PMH / PSH: HTN, HLD, Prediabetes, depression/anxiety, OSA  OBJECTIVE:   There were no vitals taken for this visit.  General: Awake and Alert in NAD HEENT: NCAT. Sclera anicteric. No rhinorrhea. Cardiovascular: RRR. No M/R/G Respiratory: CTAB, normal WOB on RA. No wheezing, crackles, rhonchi, or diminished breath sounds. Abdomen: Soft, non-tender, non-distended. Bowel sounds normoactive/hypoactive/hyperactive. *** Extremities: Able to move all extremities. No BLE edema, no deformities or significant joint findings. Skin: Warm and dry. No abrasions or rashes noted. Neuro: A&Ox***. No focal neurological deficits.  ASSESSMENT/PLAN:   Assessment & Plan      Kathrine Melena, DO [facility]

## 2023-07-20 ENCOUNTER — Ambulatory Visit: Payer: Self-pay | Admitting: Family Medicine

## 2023-07-24 NOTE — Progress Notes (Unsigned)
    SUBJECTIVE:   CHIEF COMPLAINT / HPI:   HTN - Currently on Losartan -HCTZ 50-12.5 mg daily, but has not been taking this in the last 2 weeks since he has been taking Berberine (supplement) - BP poorly controlled at last visit - No follow up since Dr. Orlando started Losartan -HCTZ   Prediabetes/Weight Gain - A1c 5.9, 8 months ago - Weight 332 pounds 11/2022, increased to 336 today - Endorses walking, daily at work and stretching - Diet: chicken and fish primarily and processed foods regularly, and salads; cutting back on portions and cheese  PERTINENT  PMH / PSH: HTN, HLD, Prediabetes, depression/anxiety, OSA (wears regularly)  OBJECTIVE:   BP (!) 166/96   Pulse 81   Ht 6' 6 (1.981 m)   Wt (!) 336 lb (152.4 kg)   SpO2 100%   BMI 38.83 kg/m   General: Awake and Alert in NAD HEENT: NCAT. Sclera anicteric. No rhinorrhea. Cardiovascular: RRR. No M/R/G Respiratory: CTAB, normal WOB on RA. No wheezing, crackles, rhonchi, or diminished breath sounds. Abdomen: Soft, non-tender, non-distended. Bowel sounds normoactive Extremities: Able to move all extremities. No BLE edema, no deformities or significant joint findings. Skin: Warm and dry. No abrasions or rashes noted. Neuro: A&Ox3. No focal neurological deficits.  ASSESSMENT/PLAN:   Assessment & Plan Primary hypertension Patient's blood pressure is not controlled today. BP: (!) 166/96. Goal of 130/80. Patient's medication regimen includes Losartan -HCTZ . - Advised adherence to current regimen - Labs: BMP - Follow up in 2 weeks Prediabetes A1c 5.9, 8 months ago.  - POCT A1c today remains stable at 5.9 Hyperlipidemia, unspecified hyperlipidemia type Lipid panel 8 months ago improved since initial diagnosis. LDL improved with Crestor  to 89.  - Lipid panel today Encounter for weight management Weight increased slightly from last visit. Discussed lifestyle modifications for weight loss. - Advised lifestyle modifications for  the next 2 weeks to see if any change was made - Could consider weight loss medications based on insurance coverage and patient preference at next visit   Kathrine Melena, DO [facility]

## 2023-07-26 ENCOUNTER — Encounter: Payer: Self-pay | Admitting: Family Medicine

## 2023-07-26 ENCOUNTER — Ambulatory Visit: Payer: Self-pay | Admitting: Family Medicine

## 2023-07-26 VITALS — BP 166/96 | HR 81 | Ht 78.0 in | Wt 336.0 lb

## 2023-07-26 DIAGNOSIS — I10 Essential (primary) hypertension: Secondary | ICD-10-CM | POA: Diagnosis not present

## 2023-07-26 DIAGNOSIS — E785 Hyperlipidemia, unspecified: Secondary | ICD-10-CM

## 2023-07-26 DIAGNOSIS — Z7689 Persons encountering health services in other specified circumstances: Secondary | ICD-10-CM | POA: Diagnosis not present

## 2023-07-26 DIAGNOSIS — R7303 Prediabetes: Secondary | ICD-10-CM | POA: Diagnosis not present

## 2023-07-26 LAB — POCT GLYCOSYLATED HEMOGLOBIN (HGB A1C): HbA1c, POC (prediabetic range): 5.9 % (ref 5.7–6.4)

## 2023-07-26 NOTE — Patient Instructions (Addendum)
 It was great to see you today! Thank you for choosing [facility] for your primary care. Jerry Stein was seen for weight loss management and hypertension.  Today we addressed: Weight management - Try to eat less processed foods (frozen meats/pre-packaged meals), more protein and vegetables, less carbohydrates (rice, pasta) and less fatty foods. Please check with your insurance what they cover for weight loss medications. Blood pressure - Please take your Losartan -hydrochlorothiazide daily over your Berberine as your blood pressure remains elevated.  When you return in 2 weeks we will recheck your BP and discuss if another medication needs to be added to your combo pill  We are checking some labs today. I will send you a MyChart message with your results, per your preference. If you do not hear about your labs in the next 2 weeks, please call the office.   You should return to our clinic Return in about 2 weeks (around 08/09/2023) for BP recheck and weight loss progress. Please arrive 15 minutes before your appointment to ensure smooth check in process.  We appreciate your efforts in making this happen.  Thank you for allowing me to participate in your care, Kathrine Melena, DO 07/26/2023, 3:28 PM PGY-2, [facility]

## 2023-07-26 NOTE — Assessment & Plan Note (Signed)
 Lipid panel 8 months ago improved since initial diagnosis. LDL improved with Crestor  to 89.  - Lipid panel today

## 2023-07-26 NOTE — Assessment & Plan Note (Signed)
 A1c 5.9, 8 months ago.  - POCT A1c today remains stable at 5.9

## 2023-07-26 NOTE — Assessment & Plan Note (Signed)
 Patient's blood pressure is not controlled today. BP: (!) 166/96. Goal of 130/80. Patient's medication regimen includes Losartan -HCTZ . - Advised adherence to current regimen - Labs: BMP - Follow up in 2 weeks

## 2023-07-27 ENCOUNTER — Ambulatory Visit: Payer: Self-pay | Admitting: Family Medicine

## 2023-07-27 LAB — LIPID PANEL
Chol/HDL Ratio: 4.4 ratio (ref 0.0–5.0)
Cholesterol, Total: 213 mg/dL — ABNORMAL HIGH (ref 100–199)
HDL: 48 mg/dL (ref >39)
LDL Chol Calc (NIH): 144 mg/dL — ABNORMAL HIGH (ref 0–99)
Triglycerides: 115 mg/dL (ref 0–149)
VLDL Cholesterol Cal: 21 mg/dL (ref 5–40)

## 2023-07-27 LAB — BASIC METABOLIC PANEL WITH GFR
BUN/Creatinine Ratio: 14 (ref 10–24)
BUN: 15 mg/dL (ref 8–27)
CO2: 19 mmol/L — ABNORMAL LOW (ref 20–29)
Calcium: 9.4 mg/dL (ref 8.6–10.2)
Chloride: 105 mmol/L (ref 96–106)
Creatinine, Ser: 1.11 mg/dL (ref 0.76–1.27)
Glucose: 137 mg/dL — ABNORMAL HIGH (ref 70–99)
Potassium: 4.1 mmol/L (ref 3.5–5.2)
Sodium: 141 mmol/L (ref 134–144)
eGFR: 75 mL/min/1.73 (ref >59)

## 2023-08-13 ENCOUNTER — Ambulatory Visit: Admitting: Family Medicine

## 2023-09-13 NOTE — Progress Notes (Deleted)
    SUBJECTIVE:   CHIEF COMPLAINT / HPI:   HTN ***  Weight Management  PERTINENT  PMH / PSH: HTN, HLD, Prediabetes, depression/anxiety, OSA (wears regularly)   OBJECTIVE:   There were no vitals taken for this visit. General: Awake and Alert in NAD HEENT: NCAT. Sclera anicteric. No rhinorrhea. Cardiovascular: RRR. No M/R/G Respiratory: CTAB, normal WOB on RA. No wheezing, crackles, rhonchi, or diminished breath sounds. Abdomen: Soft, non-tender, non-distended. Bowel sounds normoactive/hypoactive/hyperactive. *** Extremities: Able to move all extremities. No BLE edema, no deformities or significant joint findings. Skin: Warm and dry. No abrasions or rashes noted. Neuro: A&Ox***. No focal neurological deficits.  ASSESSMENT/PLAN:   Assessment & Plan      Jerry Melena, DO [facility]

## 2023-09-14 ENCOUNTER — Ambulatory Visit: Admitting: Family Medicine

## 2023-12-27 IMAGING — DX DG KNEE COMPLETE 4+V*R*
4 series · 4 of 4 positions shown · non-contrast
Comparison: None Available.

CLINICAL DATA: Anterior right knee pain after stepping up onto
ladder yesterday

EXAM:
RIGHT KNEE - COMPLETE 4+ VIEW

[knee ap]
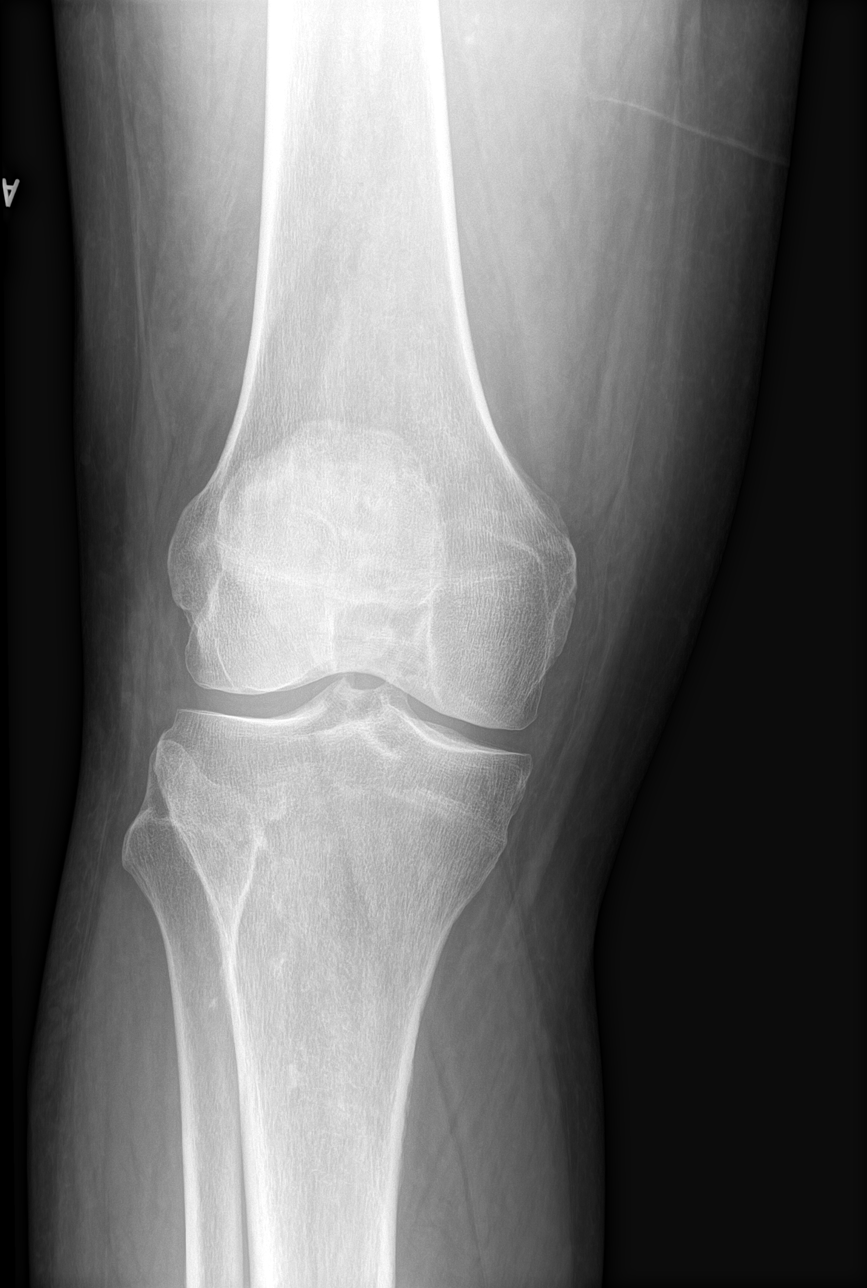

[knee obl (1 of 2)]
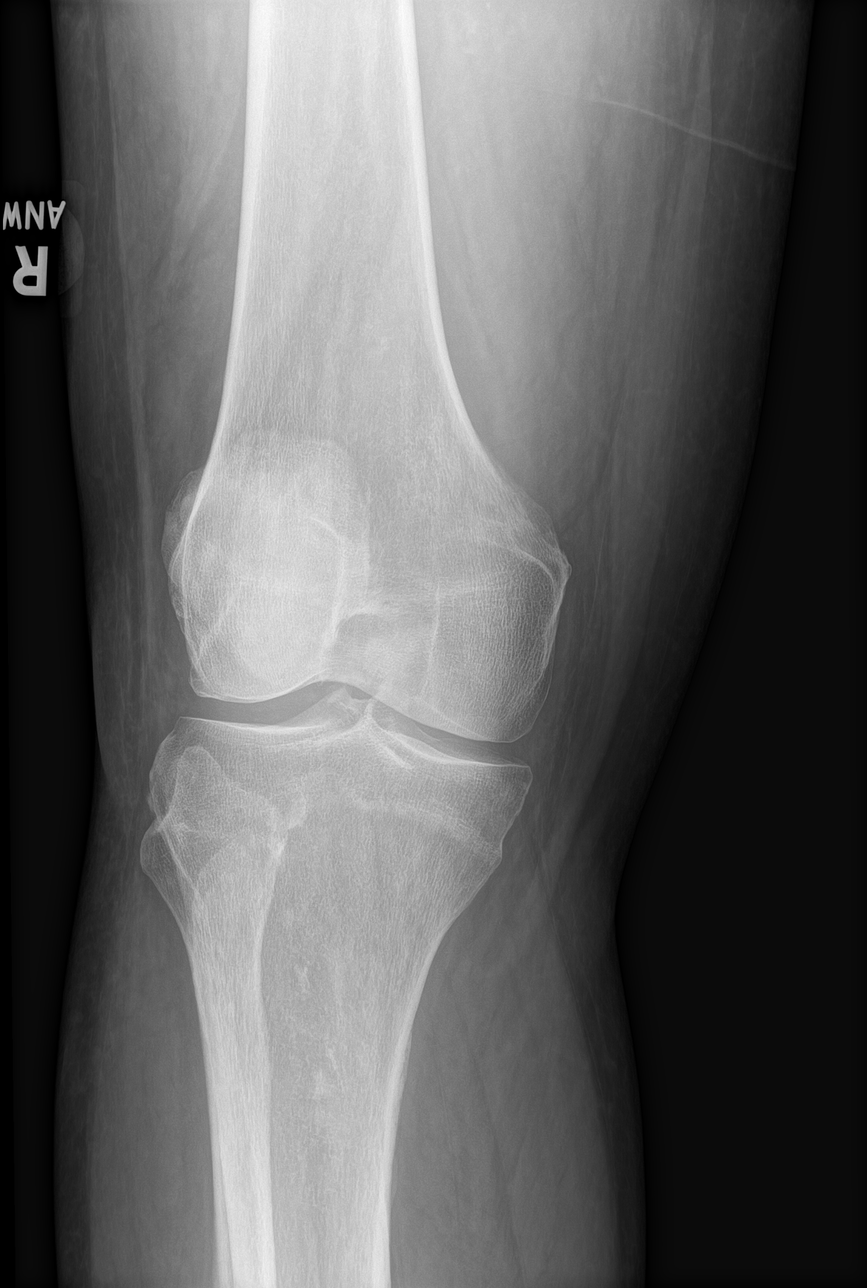

[knee obl (2 of 2)]
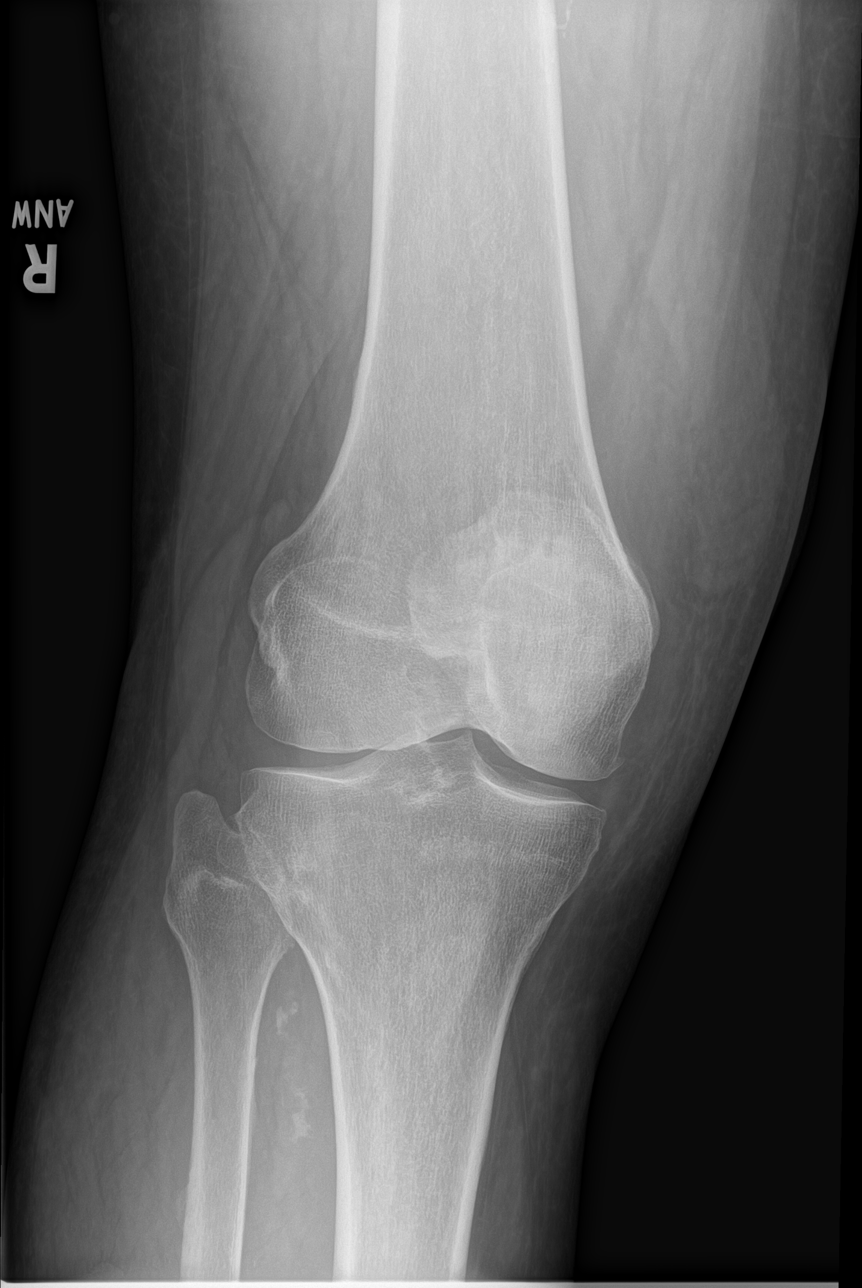

[knee lat]
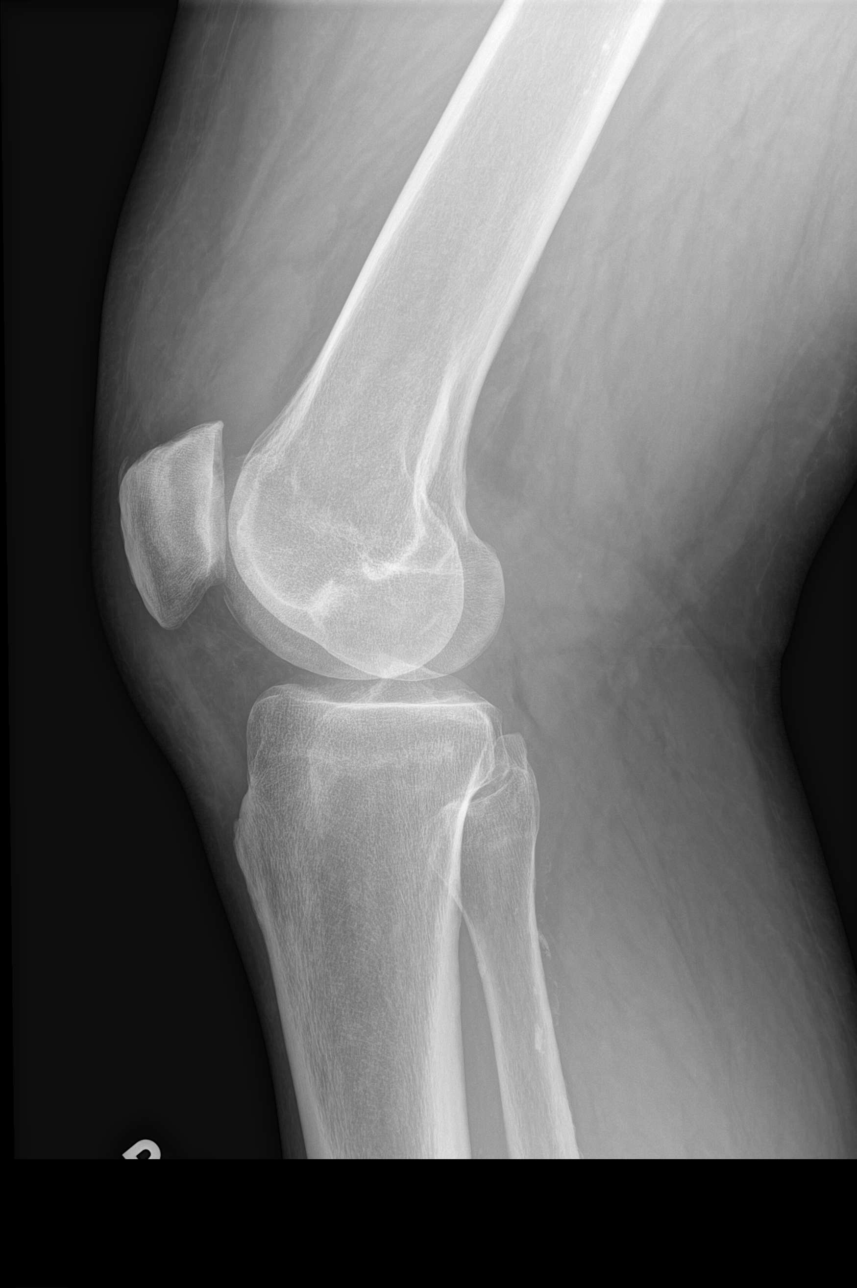

[4 of 4 positions shown; findings below may reference images not displayed]

FINDINGS: No fracture or dislocation. Minimal spurring of the medial
compartment. Small knee joint effusion. Mild atherosclerotic changes
seen throughout visualized arterial segments.
IMPRESSION: Small knee joint effusion without underlying acute osseous
abnormality.
